# Patient Record
Sex: Female | Born: 1955 | Race: White | Hispanic: No | Marital: Single | Smoking: Current every day smoker
Health system: Southern US, Community
[De-identification: ages and names within clinical notes are randomized; demographics above are authoritative.]

## PROBLEM LIST (undated history)

## (undated) DIAGNOSIS — R079 Chest pain, unspecified: Secondary | ICD-10-CM

## (undated) DIAGNOSIS — I10 Essential (primary) hypertension: Secondary | ICD-10-CM

## (undated) DIAGNOSIS — M7750 Other enthesopathy of unspecified foot: Secondary | ICD-10-CM

## (undated) DIAGNOSIS — J45909 Unspecified asthma, uncomplicated: Secondary | ICD-10-CM

## (undated) HISTORY — PX: ABDOMINAL HYSTERECTOMY: SHX81

---

## 2000-02-23 ENCOUNTER — Emergency Department (HOSPITAL_COMMUNITY): Admission: EM | Admit: 2000-02-23 | Discharge: 2000-02-23 | Payer: Self-pay

## 2000-08-31 ENCOUNTER — Emergency Department (HOSPITAL_COMMUNITY): Admission: EM | Admit: 2000-08-31 | Discharge: 2000-08-31 | Payer: Self-pay | Admitting: Internal Medicine

## 2000-08-31 ENCOUNTER — Encounter: Payer: Self-pay | Admitting: Orthopedic Surgery

## 2000-08-31 ENCOUNTER — Encounter: Payer: Self-pay | Admitting: Emergency Medicine

## 2000-09-13 ENCOUNTER — Ambulatory Visit (HOSPITAL_BASED_OUTPATIENT_CLINIC_OR_DEPARTMENT_OTHER): Admission: RE | Admit: 2000-09-13 | Discharge: 2000-09-14 | Payer: Self-pay | Admitting: Orthopedic Surgery

## 2001-06-06 ENCOUNTER — Emergency Department (HOSPITAL_COMMUNITY): Admission: EM | Admit: 2001-06-06 | Discharge: 2001-06-06 | Payer: Self-pay | Admitting: Emergency Medicine

## 2001-06-06 ENCOUNTER — Encounter: Payer: Self-pay | Admitting: Emergency Medicine

## 2001-09-03 ENCOUNTER — Emergency Department (HOSPITAL_COMMUNITY): Admission: EM | Admit: 2001-09-03 | Discharge: 2001-09-03 | Payer: Self-pay | Admitting: Emergency Medicine

## 2001-09-03 ENCOUNTER — Encounter: Payer: Self-pay | Admitting: Emergency Medicine

## 2002-05-10 ENCOUNTER — Inpatient Hospital Stay (HOSPITAL_COMMUNITY): Admission: AD | Admit: 2002-05-10 | Discharge: 2002-05-15 | Payer: Self-pay | Admitting: *Deleted

## 2002-05-13 ENCOUNTER — Encounter: Admission: RE | Admit: 2002-05-13 | Discharge: 2002-05-13 | Payer: Self-pay | Admitting: *Deleted

## 2002-06-12 ENCOUNTER — Encounter: Admission: RE | Admit: 2002-06-12 | Discharge: 2002-06-12 | Payer: Self-pay | Admitting: *Deleted

## 2011-12-02 ENCOUNTER — Emergency Department (HOSPITAL_COMMUNITY)
Admission: EM | Admit: 2011-12-02 | Discharge: 2011-12-02 | Disposition: A | Discharge status: Home / Self Care | Payer: Self-pay | Attending: Emergency Medicine | Admitting: Emergency Medicine

## 2011-12-02 ENCOUNTER — Encounter (HOSPITAL_COMMUNITY): Payer: Self-pay | Admitting: *Deleted

## 2011-12-02 DIAGNOSIS — I10 Essential (primary) hypertension: Secondary | ICD-10-CM | POA: Insufficient documentation

## 2011-12-02 DIAGNOSIS — N390 Urinary tract infection, site not specified: Secondary | ICD-10-CM | POA: Insufficient documentation

## 2011-12-02 HISTORY — DX: Essential (primary) hypertension: I10

## 2011-12-02 LAB — URINE MICROSCOPIC-ADD ON

## 2011-12-02 LAB — URINALYSIS, ROUTINE W REFLEX MICROSCOPIC
Bilirubin Urine: NEGATIVE
Glucose, UA: NEGATIVE mg/dL
Ketones, ur: NEGATIVE mg/dL
Nitrite: POSITIVE — AB
Protein, ur: NEGATIVE mg/dL
Specific Gravity, Urine: 1.006 (ref 1.005–1.030)
Urobilinogen, UA: 1 mg/dL (ref 0.0–1.0)
pH: 7 (ref 5.0–8.0)

## 2011-12-02 MED ORDER — ONDANSETRON 4 MG PO TBDP
4.0000 mg | ORAL_TABLET | Freq: Once | ORAL | Status: AC
  Administered 2011-12-02: 4 mg via ORAL
  Filled 2011-12-02: qty 1

## 2011-12-02 MED ORDER — TRAMADOL HCL 50 MG PO TABS: 50.0000 mg | ORAL_TABLET | Freq: Four times a day (QID) | ORAL | Status: AC | PRN

## 2011-12-02 MED ORDER — PROMETHAZINE HCL 25 MG PO TABS: 12.5000 mg | ORAL_TABLET | Freq: Three times a day (TID) | ORAL | Status: DC | PRN

## 2011-12-02 MED ORDER — PHENAZOPYRIDINE HCL 95 MG PO TABS: 95.0000 mg | ORAL_TABLET | Freq: Once | ORAL | Status: AC

## 2011-12-02 MED ORDER — CEPHALEXIN 500 MG PO CAPS: 500.0000 mg | ORAL_CAPSULE | Freq: Four times a day (QID) | ORAL | Status: AC

## 2011-12-02 MED ORDER — PHENAZOPYRIDINE HCL 100 MG PO TABS
95.0000 mg | ORAL_TABLET | Freq: Once | ORAL | Status: AC
  Administered 2011-12-02: 100 mg via ORAL
  Filled 2011-12-02: qty 1

## 2011-12-02 MED ORDER — SULFAMETHOXAZOLE-TMP DS 800-160 MG PO TABS
1.0000 | ORAL_TABLET | Freq: Once | ORAL | Status: AC
  Administered 2011-12-02: 1 via ORAL
  Filled 2011-12-02: qty 1

## 2011-12-02 NOTE — ED Notes (Signed)
The pt has had burning on urination since yesterday.  She thinks she has  A uti .  She has had numerus ones in the past..  She has been using azo and her urine is orange unable to  Detect blood

## 2011-12-02 NOTE — ED Provider Notes (Signed)
History     CSN: [id number]  Arrival date & time 12/02/11  2050   None     Chief Complaint  Patient presents with  . Urinary Tract Infection    (Consider location/radiation/quality/duration/timing/severity/associated sxs/prior treatment) HPI Comments: Patient has a history of multiple UTIs.  For the last several, days.  She's had, dysuria.  She's tried taking over-the-counter Azo, without any relief.  Today.  She has developed suprapubic discomfort, as well as some nausea without vomiting  Patient is a 56 y.o. female presenting with urinary tract infection. The history is provided by the patient.  Urinary Tract Infection This is a new problem. The current episode started in the past 7 days. The problem occurs constantly. The problem has been unchanged. Associated symptoms include abdominal pain. Pertinent negatives include no chills, coughing, fever, vomiting or weakness.    Past Medical History  Diagnosis Date  . Hypertension     History reviewed. No pertinent past surgical history.  No family history on file.  History  Substance Use Topics  . Smoking status: Current Everyday Smoker  . Smokeless tobacco: Not on file  . Alcohol Use: Yes    OB History    Grav Para Term Preterm Abortions TAB SAB Ect Mult Living                  Review of Systems  Constitutional: Negative for fever and chills.  Respiratory: Negative for cough and shortness of breath.   Gastrointestinal: Positive for abdominal pain. Negative for vomiting.  Genitourinary: Positive for dysuria and frequency. Negative for hematuria and flank pain.  Musculoskeletal: Negative for back pain.  Neurological: Negative for dizziness and weakness.    Allergies  Codeine and Sulfa antibiotics  Home Medications   Current Outpatient Rx  Name Route Sig Dispense Refill  . LISINOPRIL-HYDROCHLOROTHIAZIDE 10-12.5 MG PO TABS Oral Take 1 tablet by mouth daily.    Marland Kitchen PHENAZOPYRIDINE HCL 97.5 MG PO TABS Oral Take 2  tablets by mouth every 2 (two) hours as needed. For urinary pain    . CEPHALEXIN 500 MG PO CAPS Oral Take 1 capsule (500 mg total) by mouth 4 (four) times daily. 20 capsule 0  . PHENAZOPYRIDINE HCL 95 MG PO TABS Oral Take 1 tablet (95 mg total) by mouth once. 10 tablet 0  . PROMETHAZINE HCL 25 MG PO TABS Oral Take 0.5 tablets (12.5 mg total) by mouth every 8 (eight) hours as needed for nausea. 30 tablet 0  . TRAMADOL HCL 50 MG PO TABS Oral Take 1 tablet (50 mg total) by mouth every 6 (six) hours as needed for pain. 15 tablet 0    BP 134/84  Pulse 64  Temp(Src) 97.4 F (36.3 C) (Oral)  Resp 20  SpO2 100%  Physical Exam  Constitutional: She is oriented to person, place, and time. She appears well-developed.  HENT:  Head: Normocephalic.  Eyes: Pupils are equal, round, and reactive to light.  Cardiovascular: Normal rate.   Pulmonary/Chest: Effort normal.  Abdominal:       Slight suprapubic tenderness  Musculoskeletal: Normal range of motion.  Neurological: She is alert and oriented to person, place, and time.  Skin: Skin is warm.    ED Course  Procedures (including critical care time)  Labs Reviewed  URINALYSIS, ROUTINE W REFLEX MICROSCOPIC - Abnormal; Notable for the following:    Color, Urine ORANGE (*) BIOCHEMICALS MAY BE AFFECTED BY COLOR   APPearance CLOUDY (*)    Hgb urine dipstick LARGE (*)  Nitrite POSITIVE (*)    Leukocytes, UA LARGE (*)    All other components within normal limits  URINE MICROSCOPIC-ADD ON - Abnormal; Notable for the following:    Bacteria, UA MANY (*)    All other components within normal limits   No results found.   1. UTI (lower urinary tract infection)     Patient specifically asked, allergies, and she only alluded to codeine on checking her records at time of discharge.  It lists sulfa, again, patient is questioned, and she said yes, at one time with her hysterectomy.  She had sulfa, along with pain medicine, and become violently ill, so  it uncertain if she became ill because of the pain medicine, or the sulfa.  Will discharge her him with Keflex.  I will observe her in the emergency department for the next hour to make, sure she does not become "violently ill Patient has been observed and there is no change in her condition.  No vomiting, diarrhea, tachycardia, rash, safe to send this patient with antibiotic, UTI, as well as Pyridium pain  MDM   Urine shows that she has a urinary tract infection       Arman Filter, NP 12/02/11 2351

## 2011-12-02 NOTE — ED Notes (Signed)
Received bedside report from Clydie Braun, Charity fundraiser.  Patient currently resting quietly in bed; no respiratory or acute distress noted.  Patient updated on plan of care; informed patient that we are still watching her for possible reaction from antibiotic.  Patient has no other questions or concerns; will continue to monitor.

## 2011-12-02 NOTE — Discharge Instructions (Signed)
Urinary Tract Infection A urinary tract infection (UTI) is often caused by a germ (bacteria). A UTI is usually helped with medicine (antibiotics) that kills germs. Take all the medicine until it is gone. Do this even if you are feeling better. You are usually better in 7 to 10 days. HOME CARE   Drink enough water and fluids to keep your pee (urine) clear or pale yellow. Drink:   Cranberry juice.   Water.   Avoid:   Caffeine.   Tea.   Bubbly (carbonated) drinks.   Alcohol.   Only take medicine as told by your doctor.   To prevent further infections:   Pee often.   After pooping (bowel movement), women should wipe from front to back. Use each tissue only once.   Pee before and after having sex (intercourse).  Ask your doctor when your test results will be ready. Make sure you follow up and get your test results.  GET HELP RIGHT AWAY IF:   There is very bad back pain or lower belly (abdominal) pain.   You get the chills.   You have a fever.   Your baby is older than 3 months with a rectal temperature of 102 F (38.9 C) or higher.   Your baby is 3 months old or younger with a rectal temperature of 100.4 F (38 C) or higher.   You feel sick to your stomach (nauseous) or throw up (vomit).   There is continued burning with peeing.   Your problems are not better in 3 days. Return sooner if you are getting worse.  MAKE SURE YOU:   Understand these instructions.   Will watch your condition.   Will get help right away if you are not doing well or get worse.  Document Released: 11/28/2007 Document Revised: 05/31/2011 Document Reviewed: 11/28/2007 [employer] Patient Information 2012 [employer]. 

## 2011-12-02 NOTE — ED Notes (Signed)
Patient given discharge paperwork; went over discharge instructions with patient.  Patient instructed to take prescriptions as directed, to not drive while taking pain medications, to increase fluid intake, to follow up with primary care physician if symptoms persist more than 2-3 days, and to return to the ED for new, worsening, or concerning symptoms.

## 2011-12-04 NOTE — ED Provider Notes (Signed)
Medical screening examination/treatment/procedure(s) were performed by non-physician practitioner and as supervising physician I was immediately available for consultation/collaboration.   Carleene Cooper III, MD 12/04/11 [phone]

## 2012-03-30 ENCOUNTER — Ambulatory Visit (HOSPITAL_COMMUNITY)
Admission: RE | Admit: 2012-03-30 | Discharge: 2012-03-30 | Disposition: A | Discharge status: Home / Self Care | Payer: Self-pay | Attending: Psychiatry | Admitting: Psychiatry

## 2012-03-30 ENCOUNTER — Encounter (HOSPITAL_COMMUNITY): Payer: Self-pay | Admitting: *Deleted

## 2012-03-30 ENCOUNTER — Emergency Department (HOSPITAL_COMMUNITY): Payer: Self-pay

## 2012-03-30 ENCOUNTER — Emergency Department (HOSPITAL_COMMUNITY)
Admission: EM | Admit: 2012-03-30 | Discharge: 2012-04-01 | Disposition: A | Discharge status: Other Acute Inpatient Hospital | Payer: Self-pay | Attending: Emergency Medicine | Admitting: Emergency Medicine

## 2012-03-30 DIAGNOSIS — J45909 Unspecified asthma, uncomplicated: Secondary | ICD-10-CM | POA: Insufficient documentation

## 2012-03-30 DIAGNOSIS — R079 Chest pain, unspecified: Secondary | ICD-10-CM

## 2012-03-30 DIAGNOSIS — F411 Generalized anxiety disorder: Secondary | ICD-10-CM | POA: Insufficient documentation

## 2012-03-30 DIAGNOSIS — R0789 Other chest pain: Secondary | ICD-10-CM | POA: Insufficient documentation

## 2012-03-30 DIAGNOSIS — F17200 Nicotine dependence, unspecified, uncomplicated: Secondary | ICD-10-CM | POA: Insufficient documentation

## 2012-03-30 DIAGNOSIS — R0602 Shortness of breath: Secondary | ICD-10-CM | POA: Insufficient documentation

## 2012-03-30 DIAGNOSIS — I10 Essential (primary) hypertension: Secondary | ICD-10-CM | POA: Insufficient documentation

## 2012-03-30 DIAGNOSIS — F419 Anxiety disorder, unspecified: Secondary | ICD-10-CM

## 2012-03-30 DIAGNOSIS — F329 Major depressive disorder, single episode, unspecified: Secondary | ICD-10-CM | POA: Insufficient documentation

## 2012-03-30 DIAGNOSIS — M7750 Other enthesopathy of unspecified foot: Secondary | ICD-10-CM

## 2012-03-30 DIAGNOSIS — Z79899 Other long term (current) drug therapy: Secondary | ICD-10-CM | POA: Insufficient documentation

## 2012-03-30 DIAGNOSIS — F332 Major depressive disorder, recurrent severe without psychotic features: Secondary | ICD-10-CM | POA: Insufficient documentation

## 2012-03-30 DIAGNOSIS — F3289 Other specified depressive episodes: Secondary | ICD-10-CM | POA: Insufficient documentation

## 2012-03-30 HISTORY — DX: Chest pain, unspecified: R07.9

## 2012-03-30 HISTORY — DX: Other enthesopathy of unspecified foot and ankle: M77.50

## 2012-03-30 HISTORY — DX: Unspecified asthma, uncomplicated: J45.909

## 2012-03-30 LAB — COMPREHENSIVE METABOLIC PANEL
ALT: 29 U/L (ref 0–35)
AST: 18 U/L (ref 0–37)
Alkaline Phosphatase: 72 U/L (ref 39–117)
CO2: 28 mEq/L (ref 19–32)
Calcium: 9.1 mg/dL (ref 8.4–10.5)
Chloride: 100 mEq/L (ref 96–112)
GFR calc Af Amer: >90 mL/min (ref >90)
GFR calc non Af Amer: >90 mL/min (ref >90)
Glucose, Bld: 71 mg/dL (ref 70–99)
Potassium: 3.5 mEq/L (ref 3.5–5.1)
Sodium: 138 mEq/L (ref 135–145)
Total Bilirubin: 0.3 mg/dL (ref 0.3–1.2)

## 2012-03-30 LAB — POCT I-STAT TROPONIN I
Troponin i, poc: 0.01 ng/mL (ref 0.00–0.08)
Troponin i, poc: 0.01 ng/mL (ref 0.00–0.08)

## 2012-03-30 LAB — SALICYLATE LEVEL: Salicylate Lvl: <2 mg/dL — ABNORMAL LOW (ref 2.8–20.0)

## 2012-03-30 LAB — ACETAMINOPHEN LEVEL: Acetaminophen (Tylenol), Serum: <15 ug/mL (ref 10–30)

## 2012-03-30 LAB — CBC
Hemoglobin: 14 g/dL (ref 12.0–15.0)
MCH: 31.5 pg (ref 26.0–34.0)
Platelets: 350 10*3/uL (ref 150–400)
RBC: 4.44 MIL/uL (ref 3.87–5.11)
WBC: 10 10*3/uL (ref 4.0–10.5)

## 2012-03-30 LAB — RAPID URINE DRUG SCREEN, HOSP PERFORMED
Barbiturates: NOT DETECTED
Tetrahydrocannabinol: POSITIVE — AB

## 2012-03-30 MED ORDER — ACETAMINOPHEN 325 MG PO TABS: 650.0000 mg | ORAL_TABLET | ORAL | Status: DC | PRN

## 2012-03-30 MED ORDER — LORAZEPAM 1 MG PO TABS
1.0000 mg | ORAL_TABLET | Freq: Three times a day (TID) | ORAL | Status: DC | PRN
  Administered 2012-03-30 – 2012-04-01 (×5): 1 mg via ORAL
  Filled 2012-03-30 (×8): qty 1

## 2012-03-30 MED ORDER — ONDANSETRON HCL 4 MG PO TABS: 4.0000 mg | ORAL_TABLET | Freq: Three times a day (TID) | ORAL | Status: DC | PRN

## 2012-03-30 MED ORDER — ZOLPIDEM TARTRATE 5 MG PO TABS: 5.0000 mg | ORAL_TABLET | Freq: Every evening | ORAL | Status: DC | PRN

## 2012-03-30 MED ORDER — IBUPROFEN 200 MG PO TABS
600.0000 mg | ORAL_TABLET | Freq: Three times a day (TID) | ORAL | Status: DC | PRN
  Administered 2012-03-30 – 2012-03-31 (×2): 600 mg via ORAL
  Filled 2012-03-30 (×3): qty 3

## 2012-03-30 MED ORDER — ASPIRIN 81 MG PO CHEW
324.0000 mg | CHEWABLE_TABLET | Freq: Once | ORAL | Status: AC
  Administered 2012-03-30: 324 mg via ORAL
  Filled 2012-03-30: qty 4

## 2012-03-30 NOTE — ED Notes (Signed)
Pt went to [facility] and was sent here, no rooms available at facility, pt c/o depression and nerve problems. Has been on Prozac, family told here to stop taking. No S/I or H/I

## 2012-03-30 NOTE — ED Notes (Signed)
Environmental consultant in to see and evaluate this pt

## 2012-03-30 NOTE — ED Notes (Addendum)
Pt has been wanded and changed into scrubs and socks. 1 bag of personal belongings in locker #26
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
## 2012-03-30 NOTE — ED Notes (Signed)
Charge [employer] RN given report- in with this pt.  Pt remains calm and cooperative with care

## 2012-03-30 NOTE — ED Notes (Signed)
Lab at bs

## 2012-03-30 NOTE — ED Provider Notes (Signed)
History     CSN: [id number]  Arrival date & time 03/30/12  1343   First MD Initiated Contact with Patient 03/30/12 1425      Chief Complaint  Patient presents with  . Medical Clearance    (Consider location/radiation/quality/duration/timing/severity/associated sxs/prior treatment) HPI Comments: Carolyn Vincent is a 56 y.o. Female who presents to ED complaining of depression, anxiety, chest pain. States has been battling depression and anxiety for a long time, states it is worsening. Denies SI and HI. States also having chest pains for last 6 months. States worsened with anxiety, stress, and exertion. States pain usually starts around lunch time and lasts all day. States at times wakes up with it. Pain is pressure like. Not improved by anything. Denies pleuritic component. Denies leg swelling. States often feels short of breath, nauseated.  States has never seen or been evaluated for it in the past. [facility] and was sent here for medical clearance. Pt apparently is accepted there for treatment of her depression, as soon as a bed becomes available.    Past Medical History  Diagnosis Date  . Hypertension   . Tendonitis of ankle or foot 03/30/2012    Left side  . Asthma 03/30/2012    Reports Hx of "asthma attacks" but not asthma.  . Chest pain 03/30/2012    Recurrent; scales at 5 of 10 at this time (12:45)    Past Surgical History  Procedure Date  . Abdominal hysterectomy   . Arm amputation     No family history on file.  History  Substance Use Topics  . Smoking status: Current Every Day Smoker -- 1.0 packs/day for 40 years    Types: Cigarettes  . Smokeless tobacco: Never Used  . Alcohol Use: Yes     occ    OB History    Grav Para Term Preterm Abortions TAB SAB Ect Mult Living                  Review of Systems  Constitutional: Negative for fever and chills.  HENT: Negative for neck pain and neck stiffness.   Respiratory: Positive for chest  tightness and shortness of breath. Negative for cough and wheezing.   Cardiovascular: Positive for chest pain. Negative for palpitations and leg swelling.  Gastrointestinal: Positive for nausea. Negative for vomiting, abdominal pain, diarrhea and constipation.  Genitourinary: Negative for dysuria.  Musculoskeletal: Negative for back pain.  Skin: Negative.   Neurological: Negative for dizziness, weakness, numbness and headaches.  Hematological: Negative.     Allergies  Codeine and Sulfa antibiotics  Home Medications   Current Outpatient Rx  Name Route Sig Dispense Refill  . ALPRAZOLAM 1 MG PO TABS Oral Take 1 mg by mouth 4 (four) times daily as needed. For anxiety.    . INDOMETHACIN 25 MG PO CAPS Oral Take 25 mg by mouth 2 (two) times daily with a meal.     . LISINOPRIL-HYDROCHLOROTHIAZIDE 10-12.5 MG PO TABS Oral Take 1 tablet by mouth every evening.       BP 149/89  Pulse 82  Temp 97.5 F (36.4 C) (Oral)  SpO2 100%  Physical Exam  Nursing note and vitals reviewed. Constitutional: She is oriented to person, place, and time. She appears well-developed and well-nourished. No distress.  HENT:  Head: Normocephalic.  Eyes: Conjunctivae normal are normal.  Neck: Neck supple.  Cardiovascular: Normal rate, regular rhythm and normal heart sounds.   Pulmonary/Chest: Effort normal and breath sounds  normal. No respiratory distress. She has no wheezes. She has no rales. She exhibits no tenderness.  Abdominal: Soft. Bowel sounds are normal. She exhibits no distension. There is no tenderness. There is no rebound.  Musculoskeletal: She exhibits no edema.  Neurological: She is alert and oriented to person, place, and time.  Skin: Skin is warm and dry.  Psychiatric:       anxious    ED Course  Procedures (including critical care time)  Pt with anxiety and chest pain. Pain sounds atypical, however, pt has no prior CP work up. WIll get ECG, enzymes, labs, CXR. Pt is a smoker, hx of asthma.  Could be related to asthma and early copd, anxiety vs cad.    Date: 03/30/2012  Rate: 82  Rhythm: normal sinus rhythm  QRS Axis: normal  Intervals: normal  ST/T Wave abnormalities: normal  Conduction Disutrbances:none  Narrative Interpretation:   Old EKG Reviewed: none available   Results for orders placed during the hospital encounter of 03/30/12  CBC      Component Value Range   WBC 10.0  4.0 - 10.5 K/uL   RBC 4.44  3.87 - 5.11 MIL/uL   Hemoglobin 14.0  12.0 - 15.0 g/dL   HCT 16.1  09.6 - 04.5 %   MCV 87.6  78.0 - 100.0 fL   MCH 31.5  26.0 - 34.0 pg   MCHC 36.0  30.0 - 36.0 g/dL   RDW 40.9  81.1 - 91.4 %   Platelets 350  150 - 400 K/uL  COMPREHENSIVE METABOLIC PANEL      Component Value Range   Sodium 138  135 - 145 mEq/L   Potassium 3.5  3.5 - 5.1 mEq/L   Chloride 100  96 - 112 mEq/L   CO2 28  19 - 32 mEq/L   Glucose, Bld 71  70 - 99 mg/dL   BUN 10  6 - 23 mg/dL   Creatinine, Ser 7.82  0.50 - 1.10 mg/dL   Calcium 9.1  8.4 - 95.6 mg/dL   Total Protein 6.4  6.0 - 8.3 g/dL   Albumin 3.8  3.5 - 5.2 g/dL   AST 18  0 - 37 U/L   ALT 29  0 - 35 U/L   Alkaline Phosphatase 72  39 - 117 U/L   Total Bilirubin 0.3  0.3 - 1.2 mg/dL   GFR calc non Af Amer >90  >90 mL/min   GFR calc Af Amer >90  >90 mL/min  ETHANOL      Component Value Range   Alcohol, Ethyl (B) <11  0 - 11 mg/dL  URINE RAPID DRUG SCREEN (HOSP PERFORMED)      Component Value Range   Opiates NONE DETECTED  NONE DETECTED   Cocaine NONE DETECTED  NONE DETECTED   Benzodiazepines POSITIVE (*) NONE DETECTED   Amphetamines NONE DETECTED  NONE DETECTED   Tetrahydrocannabinol POSITIVE (*) NONE DETECTED   Barbiturates NONE DETECTED  NONE DETECTED  ACETAMINOPHEN LEVEL      Component Value Range   Acetaminophen (Tylenol), Serum <15.0  10 - 30 ug/mL  SALICYLATE LEVEL      Component Value Range   Salicylate Lvl <2.0 (*) 2.8 - 20.0 mg/dL  POCT I-STAT TROPONIN I      Component Value Range   Troponin i, poc 0.01  0.00  - 0.08 ng/mL   Comment 3            Dg Chest 2 View  03/30/2012  *  RADIOLOGY REPORT*  Clinical Data: Medical clearance.  Smoker.  CHEST - 2 VIEW  Comparison: 09/01/2007 radiographs.  Findings: The heart size and mediastinal contours are normal. The lungs are clear. There is no pleural effusion or pneumothorax. No acute osseous findings are identified.  An EKG snap overlies the upper left chest on the frontal examination.  IMPRESSION: No active cardiopulmonary process.   Original Report Authenticated By: Gerrianne Scale, M.D.    3:45 PM Labs unremarkable. Troponin x1 negative. CXR negative. ECG unremarkable. I suspect pt's symptoms are caused mainly by anxiety, given she is very anxious here in ed. She already accepted by [facility], confirmed by [facility]. Pt here pending bed. Will recheck another troponin at 7pm   1. Anxiety   2. Depression       MDM          Lottie Mussel, [place] 04/02/12 2054

## 2012-03-30 NOTE — ED Notes (Signed)
Pt very anxious and focusing that she is having chest pain. Pain reported to be present when she feels anxious and panicky. Talking continuously about feeling depressed and anxious, again main focus is chest discomfort, pt denies any intent to harm self or anyone else, well dressed and compliant upon arrival. States she took Prozac for a long time until her family convinced her to stop taking it, she states she felt much better while on the medication. Pt went to [facility] and due to no rooms was sent here for clearance and wait for available bed to come open.

## 2012-03-31 MED ORDER — NICOTINE 21 MG/24HR TD PT24
21.0000 mg | MEDICATED_PATCH | Freq: Once | TRANSDERMAL | Status: DC
  Administered 2012-03-31: 21 mg via TRANSDERMAL
  Filled 2012-03-31: qty 1

## 2012-03-31 MED ORDER — DIPHENHYDRAMINE HCL 25 MG PO CAPS
25.0000 mg | ORAL_CAPSULE | Freq: Once | ORAL | Status: AC
  Administered 2012-03-31: 25 mg via ORAL

## 2012-03-31 MED ORDER — LISINOPRIL 10 MG PO TABS
10.0000 mg | ORAL_TABLET | Freq: Two times a day (BID) | ORAL | Status: DC
  Administered 2012-03-31: 10 mg via ORAL
  Filled 2012-03-31 (×4): qty 1

## 2012-03-31 MED ORDER — DIPHENHYDRAMINE HCL 25 MG PO CAPS
ORAL_CAPSULE | ORAL | Status: AC
  Filled 2012-03-31: qty 1

## 2012-03-31 MED ORDER — LORAZEPAM 1 MG PO TABS
1.0000 mg | ORAL_TABLET | Freq: Once | ORAL | Status: AC
  Administered 2012-03-31: 1 mg via ORAL

## 2012-03-31 NOTE — ED Provider Notes (Signed)
No new c/o  Carolyn Hutching, MD 03/31/12 [phone]

## 2012-03-31 NOTE — ED Notes (Signed)
Patient reports headache is still a 6.  Ativan 1mg  given prn.

## 2012-03-31 NOTE — ED Notes (Signed)
Pt c/o headache she rates as a 6.  Pt requesting ibuprofen for pain.  600mg  given per prn order.

## 2012-04-01 ENCOUNTER — Inpatient Hospital Stay (HOSPITAL_COMMUNITY)
Admission: AD | Admit: 2012-04-01 | Discharge: 2012-04-03 | DRG: 885 | Disposition: A | Discharge status: Home / Self Care | Payer: Federal, State, Local not specified - Other | Source: Ambulatory Visit | Attending: Psychiatry | Admitting: Psychiatry

## 2012-04-01 ENCOUNTER — Encounter (HOSPITAL_COMMUNITY): Payer: Self-pay | Admitting: *Deleted

## 2012-04-01 ENCOUNTER — Telehealth (HOSPITAL_COMMUNITY): Payer: Self-pay | Admitting: Licensed Clinical Social Worker

## 2012-04-01 DIAGNOSIS — IMO0002 Reserved for concepts with insufficient information to code with codable children: Secondary | ICD-10-CM

## 2012-04-01 DIAGNOSIS — F329 Major depressive disorder, single episode, unspecified: Secondary | ICD-10-CM | POA: Diagnosis present

## 2012-04-01 DIAGNOSIS — F1320 Sedative, hypnotic or anxiolytic dependence, uncomplicated: Secondary | ICD-10-CM

## 2012-04-01 DIAGNOSIS — Z79899 Other long term (current) drug therapy: Secondary | ICD-10-CM

## 2012-04-01 DIAGNOSIS — F332 Major depressive disorder, recurrent severe without psychotic features: Principal | ICD-10-CM | POA: Diagnosis present

## 2012-04-01 DIAGNOSIS — R079 Chest pain, unspecified: Secondary | ICD-10-CM | POA: Diagnosis present

## 2012-04-01 DIAGNOSIS — T424X5A Adverse effect of benzodiazepines, initial encounter: Secondary | ICD-10-CM | POA: Diagnosis present

## 2012-04-01 DIAGNOSIS — J45909 Unspecified asthma, uncomplicated: Secondary | ICD-10-CM | POA: Diagnosis present

## 2012-04-01 DIAGNOSIS — F411 Generalized anxiety disorder: Secondary | ICD-10-CM

## 2012-04-01 DIAGNOSIS — F418 Other specified anxiety disorders: Secondary | ICD-10-CM | POA: Diagnosis present

## 2012-04-01 DIAGNOSIS — I10 Essential (primary) hypertension: Secondary | ICD-10-CM | POA: Diagnosis present

## 2012-04-01 MED ORDER — FLUOXETINE HCL 10 MG PO CAPS
10.0000 mg | ORAL_CAPSULE | ORAL | Status: DC
  Administered 2012-04-02: 10 mg via ORAL
  Filled 2012-04-01 (×3): qty 1

## 2012-04-01 MED ORDER — ARIPIPRAZOLE 10 MG PO TABS
10.0000 mg | ORAL_TABLET | Freq: Once | ORAL | Status: AC
  Administered 2012-04-01: 10 mg via ORAL
  Filled 2012-04-01 (×2): qty 1

## 2012-04-01 MED ORDER — MAGNESIUM HYDROXIDE 400 MG/5ML PO SUSP: 30.0000 mL | Freq: Every day | ORAL | Status: DC | PRN

## 2012-04-01 MED ORDER — LISINOPRIL-HYDROCHLOROTHIAZIDE 10-12.5 MG PO TABS: 1.0000 | ORAL_TABLET | Freq: Every evening | ORAL | Status: DC

## 2012-04-01 MED ORDER — INDOMETHACIN 25 MG PO CAPS
25.0000 mg | ORAL_CAPSULE | Freq: Two times a day (BID) | ORAL | Status: DC
  Filled 2012-04-01 (×3): qty 1

## 2012-04-01 MED ORDER — CHLORDIAZEPOXIDE HCL 25 MG PO CAPS: 25.0000 mg | ORAL_CAPSULE | Freq: Four times a day (QID) | ORAL | Status: DC | PRN

## 2012-04-01 MED ORDER — HYDROCHLOROTHIAZIDE 12.5 MG PO CAPS
12.5000 mg | ORAL_CAPSULE | Freq: Every day | ORAL | Status: DC
  Administered 2012-04-01 – 2012-04-02 (×2): 12.5 mg via ORAL
  Filled 2012-04-01 (×5): qty 1

## 2012-04-01 MED ORDER — NICOTINE 21 MG/24HR TD PT24
21.0000 mg | MEDICATED_PATCH | Freq: Every day | TRANSDERMAL | Status: DC
  Administered 2012-04-01 – 2012-04-03 (×3): 21 mg via TRANSDERMAL
  Filled 2012-04-01 (×5): qty 1
  Filled 2012-04-01: qty 14

## 2012-04-01 MED ORDER — HYDROXYZINE HCL 50 MG PO TABS
50.0000 mg | ORAL_TABLET | Freq: Every evening | ORAL | Status: DC | PRN
  Administered 2012-04-01 – 2012-04-02 (×3): 50 mg via ORAL
  Filled 2012-04-01: qty 1
  Filled 2012-04-01: qty 28
  Filled 2012-04-01 (×6): qty 1
  Filled 2012-04-01: qty 28

## 2012-04-01 MED ORDER — CLONIDINE HCL 0.1 MG/24HR TD PTWK
0.1000 mg | MEDICATED_PATCH | TRANSDERMAL | Status: DC
  Administered 2012-04-01: 0.1 mg via TRANSDERMAL
  Filled 2012-04-01 (×2): qty 1

## 2012-04-01 MED ORDER — ACETAMINOPHEN 325 MG PO TABS: 650.0000 mg | ORAL_TABLET | Freq: Four times a day (QID) | ORAL | Status: DC | PRN

## 2012-04-01 MED ORDER — FLUOXETINE HCL 10 MG PO CAPS
10.0000 mg | ORAL_CAPSULE | Freq: Every day | ORAL | Status: DC
  Filled 2012-04-01 (×2): qty 1

## 2012-04-01 MED ORDER — ALPRAZOLAM 0.5 MG PO TABS
0.5000 mg | ORAL_TABLET | Freq: Three times a day (TID) | ORAL | Status: DC
  Administered 2012-04-01: 0.5 mg via ORAL
  Filled 2012-04-01: qty 1

## 2012-04-01 MED ORDER — ALUM & MAG HYDROXIDE-SIMETH 200-200-20 MG/5ML PO SUSP: 30.0000 mL | ORAL | Status: DC | PRN

## 2012-04-01 MED ORDER — LISINOPRIL 10 MG PO TABS
10.0000 mg | ORAL_TABLET | Freq: Every day | ORAL | Status: DC
  Administered 2012-04-01 – 2012-04-02 (×2): 10 mg via ORAL
  Filled 2012-04-01 (×5): qty 1

## 2012-04-01 NOTE — H&P (Signed)
Medical/psychiatric screening examination/treatment/procedure(s) were performed by non-physician practitioner and as supervising physician I was immediately available for consultation/collaboration.  I have seen and examined this patient and agree with the major elements of this evaluation.  
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
## 2012-04-01 NOTE — Progress Notes (Signed)
Psychoeducational Group Note  Date:  04/01/2012 Time: 2000  Group Topic/Focus:  AA Meeting.  Participation Level:  Active  Participation Quality:  Patient did attend AA meeting tonight.  Affect:    Cognitive:  Oriented  Insight:    Engagement in Group:    Additional Comments:  Patient did attend AA meeting tonight.  Alexander Aument, [place] 04/01/2012, 9:47 PM

## 2012-04-01 NOTE — ED Notes (Signed)
Pt states "I was giving an abx for my foot that had sulphur in it, I'm allergic to sulphur, it made me have 3 anxiety attacks, I thought it was my heart, does it normally take this long to get a bed across the street?"; informed pt we never know when rooms become available across the street until they call stating they have a room; pt verbalized understanding.

## 2012-04-01 NOTE — [facility] Counselor (Signed)
Patient accepted to [facility] by Dr. Theotis Barrio to Dr. Dan Humphreys. The bed assignment is 507-1. All support paperwork completed and faxed to [facility]. The EDP-Dr. Anitra Lauth made aware of patient's disposition and agrees to discharge patient to [facility] for inpatient treatment. Also, spoke to patient's nurse-Elaine and made her aware of patient's disposition. She will call report prior discharging patient and other arrangements as needed to transition patient from [facility] to [facility].

## 2012-04-01 NOTE — Progress Notes (Signed)
[facility] Adult Inpatient Family/Significant Other Suicide Prevention Education  Suicide Prevention Education:  Patient Refusal for Family/Significant Other Suicide Prevention Education: The patient Carolyn Vincent has refused to provide written consent for family/significant other to be provided Family/Significant Other Suicide Prevention Education during admission and/or prior to discharge.  Physician notified.  Christen Butter 04/01/2012, 3:34 PM

## 2012-04-01 NOTE — Tx Team (Signed)
Initial Interdisciplinary Treatment Plan  PATIENT STRENGTHS: (choose at least two) Ability for insight Average or above average intelligence Capable of independent living Communication skills General fund of knowledge Motivation for treatment/growth Supportive family/friends  PATIENT STRESSORS: Financial difficulties Marital or family conflict Medication change or noncompliance Occupational concerns Substance abuse   PROBLEM LIST: Problem List/Patient Goals Date to be addressed Date deferred Reason deferred Estimated date of resolution  Substance abuse 04-01-2012   D/c        anxiety 04-01-2012   D/c        depression 04-01-2012   D/c                           DISCHARGE CRITERIA:  Ability to meet basic life and health needs Adequate post-discharge living arrangements Improved stabilization in mood, thinking, and/or behavior Motivation to continue treatment in a less acute level of care Need for constant or close observation no longer present Reduction of life-threatening or endangering symptoms to within safe limits Safe-care adequate arrangements made Verbal commitment to aftercare and medication compliance Withdrawal symptoms are absent or subacute and managed without 24-hour nursing intervention  PRELIMINARY DISCHARGE PLAN: Attend aftercare/continuing care group Attend PHP/IOP Attend 12-step recovery group Outpatient therapy Participate in family therapy Placement in alternative living arrangements  PATIENT/FAMIILY INVOLVEMENT: This treatment plan has been presented to and reviewed with the patient, ROSINA CRESSLER.  The patient and family have been given the opportunity to ask questions and make suggestions.  Quintella Reichert [facility] 04/01/2012, 11:21 AM

## 2012-04-01 NOTE — [facility] Suicide Risk Assessment (Signed)
Suicide Risk Assessment  Admission Assessment     Nursing information obtained from:    Demographic factors:    Current Mental Status:    Loss Factors:    Historical Factors:    Risk Reduction Factors:     CLINICAL FACTORS:   Severe Anxiety and/or Agitation  COGNITIVE FEATURES THAT CONTRIBUTE TO RISK:  Thought constriction (tunnel vision)    SUICIDE RISK:   Minimal: No identifiable suicidal ideation.  Patients presenting with no risk factors but with morbid ruminations; may be classified as minimal risk based on the severity of the depressive symptoms  PLAN OF CARE: Pt has passive suicidal ideation.  She will report any suicidal thoughts and or plans to staff.  She will participate in group therapy, individual therapy; meet with the psychiatrist and discuss medication choices.  She will report any side effects and any suicidal thoughts to staff.  She will learn about the long term side effects of benzodiazepines.  She says she is willing to start a very gradual taper of Xanax and transition to other medication and behavioral approaches to relaxation.  She will report any suicidal thoughts at least 2 days before discharge.  It is encouraged that she continue treatment with a therapist to learn to self-soothe with meditation and preferable continue with a psychiatrist.  Mickeal Skinner 04/01/2012, 3:33 PM
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
## 2012-04-01 NOTE — Progress Notes (Signed)
[facility] Group Notes:  (Counselor/Nursing/MHT/Case Management/Adjunct)  04/01/2012 3:00 PM  Type of Therapy: Group Therapy   Participation Level: Minimal   Participation Quality: Limited  Affect: Depressed  Cognitive: oriented, alert   Insight: minimal  Engagement in Group: Limited  Modes of Intervention: Clarification, Education, Problem-solving, Socialization, Encouragement and Support   Summary of Progress/Problems: Pt participated in group by listening attentively and self disclosing.  Therapist addressed the concept of Recovery.  Therapist prompted patients to explain their understanding of their diagnosis and identify 2 symptoms of their disease.  Therapist facilitated a discussion about the social stigma surrounding mental health.  Therapist asked patients to explain how their families understand and deal with their disease.   Pt stated she has Anxiety with severe panic attacks.  Her symptoms include fear, hyperventilating, unable to breath.  Pt explained she was barely surviving.  Pt was receptive to positive feedback. Therapist encouraged patients to develop and maintain strong support groups to enhance their recovery efforts.  Therapist offered support and encouragement.  Some progress noted.  Intervention effective.         Carolyn Vincent C 10/8//2013  3:00 PM
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
## 2012-04-01 NOTE — Progress Notes (Signed)
Pt is a new admit to the 500 hall this evening.  Presented to the ED with persistent panic attacks with SI to OD on Ambien.  Pt reports a lot of family conflict and relationship problems.  She was started on Xanax by her family MD, but feels it is causing her to have chest pains.  She is adamant that she does not have "drug problems".  She drinks beer occasionally and smokes marijuana.  She would like to be detoxed from the Xanax.  She is pleasant/cooperative.  Orders were received.  She was started on a Clonidine patch 0.1 mg as her BP was elevated.  Pt was encouraged to make her needs known to staff.  Pt voices no needs/concerns at this time.  Pt denies SI/HI/AV.  Safety maintained with q15 minute checks.

## 2012-04-01 NOTE — [facility] Suicide Risk Assessment (Signed)
Suicide Risk Assessment  Admission Assessment     Nursing information obtained from:    Demographic factors:    Current Mental Status:    Loss Factors:    Historical Factors:    Risk Reduction Factors:     CLINICAL FACTORS:   Severe Anxiety and/or Agitation  COGNITIVE FEATURES THAT CONTRIBUTE TO RISK:  Thought constriction (tunnel vision)    SUICIDE RISK:   Moderate:  Frequent suicidal ideation with limited intensity, and duration, some specificity in terms of plans, no associated intent, good self-control, limited dysphoria/symptomatology, some risk factors present, and identifiable protective factors, including available and accessible social support.  PLAN OF CARE:Carolyn Vincent will participate group therapy, individual session, take medication(s)and report any side effects to the MD, report any suicidal thoughts or plans and she will learn about benzodiazepine short and long term side effects. She says she is willing to taper from the 2-3 Xanax/night to gradually DC this medication .  She will report loss of suicidal thoughts at least 2 days prior to discharge and work on self-soothing techniques to discover alternatives to self-destructive actions.  She is encouraged to begin seeing an outpatient psychiatrist and/or therapist for mood regulation.   Carolyn Vincent 04/01/2012, 3:05 PM

## 2012-04-02 ENCOUNTER — Encounter (HOSPITAL_COMMUNITY): Payer: Self-pay

## 2012-04-02 DIAGNOSIS — T424X5A Adverse effect of benzodiazepines, initial encounter: Secondary | ICD-10-CM | POA: Diagnosis present

## 2012-04-02 DIAGNOSIS — F332 Major depressive disorder, recurrent severe without psychotic features: Principal | ICD-10-CM

## 2012-04-02 LAB — TSH: TSH: 1.489 u[IU]/mL (ref 0.350–4.500)

## 2012-04-02 NOTE — Progress Notes (Signed)
  D) Patient anxious and needy upon my assessment. Patient states slept "fair," and  appetite is "improving." Patient rates depression as  6 /10, patient rates hopeless feelings as  6/10. Patient denies SI/HI, denies A/V hallucinations.   A) Patient offered support and encouragement, patient encouraged to discuss feelings/concerns with staff. Patient verbalized understanding. Patient monitored Q15 minutes for safety. Patient met with MD and treatment team to discuss today's goals and plan of care.  R) Patient active on unit, attending groups in day room and meals in dining room.  Patient has a plan to take "no more xanax and continue her antidepressants."  Patient taking medications as ordered. Will continue to monitor.

## 2012-04-02 NOTE — ED Provider Notes (Signed)
Medical screening examination/treatment/procedure(s) were performed by non-physician practitioner and as supervising physician I was immediately available for consultation/collaboration.   Lyanne Co, MD 04/02/12 2109

## 2012-04-02 NOTE — Progress Notes (Signed)
Nutrition Note  Reason: MST score 2  Patient reported her appetite has not been well over the past week. She reported this happens frequently due to the feeling of nausea. She reported her weight is down 2 lb over 2 weeks. She reported a weight of 118 lb at a doctors appointment two weeks ago. She reported she is not eating well because she is not at home.   Wt Readings from Last 10 Encounters:  04/01/12 116 lb (52.617 kg)   I have encouraged the patient to have adequate PO intake. She denies the need the need for nutritional supplements. I have educated the patient on good sources of protein and I have encouraged her to incorporate them into her meals. I recommend she try carnation instant breakfast after discharge to help increase caloric and protein intake. She was without any nutrition related questions or concerns and verbalized understanding of the nutrition information provided.   RD available for nutrition needs.   Iven Finn [facility] [phone]

## 2012-04-02 NOTE — Progress Notes (Signed)
Psychoeducational Group Note  Date:  04/02/2012 Time:  1000  Group Topic/Focus:  Personal Developement- Wisdom Cards Activity  Participation Level:  Active  Participation Quality:  Appropriate, Attentive and Sharing  Affect:  Appropriate  Cognitive:  Appropriate  Insight:  Good  Engagement in Group:  Good  Additional Comments: Pt attended group and was given two cards related to wisdom. Pt used two cards to explain to group setting about how the card applied in personal life and how the card could be used as a positive factor. Pt cards received stated healing (not blaming self for things not control of) Pt other card stated release unecessary thoughts.    Karleen Hampshire Brittini 04/02/2012, 2:01 PM
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
## 2012-04-02 NOTE — Progress Notes (Signed)
Psychoeducational Group Note  Date:  04/02/2012 Time:  2000  Group Topic/Focus:  Wrap-Up Group:   The focus of this group is to help patients review their daily goal of treatment and discuss progress on daily workbooks.  Participation Level:  Active  Participation Quality:  Appropriate, Attentive, Sharing and Supportive  Affect:  Anxious  Cognitive:  Appropriate  Insight:  Good  Engagement in Group:  Good  Additional Comments:  Patient shared that she needed to focus on the things she did have in her control and to attempt to "let go of" things she did not "have in [her] control.  Taja Pentland, Newton Pigg 04/02/2012, 9:57 PM

## 2012-04-02 NOTE — Progress Notes (Signed)
Psychoeducational Group Note  Date:  04/02/2012 Time:  1100  Group Topic/Focus:  Personal Choices and Values:   The focus of this group is to help patients assess and explore the importance of values in their lives, how their values affect their decisions, how they express their values and what opposes their expression.  Participation Level:  Active  Participation Quality:  Appropriate, Sharing and Supportive  Affect:  Appropriate  Cognitive:  Appropriate  Insight:  Good  Engagement in Group:  Good  Additional Comments:  none  Nereida Schepp M 04/02/2012, 12:12 PM

## 2012-04-02 NOTE — Tx Team (Signed)
Interdisciplinary Treatment Plan Update (Adult)  Date:  04/02/2012  Time Reviewed:  10:51 AM   Progress in Treatment: Attending groups: Yes Participating in groups:  Yes Taking medication as prescribed:  Yes Tolerating medication: Yes Family/Significant othe contact made:  No, Eliot refuses to allow contact to be made with supports Patient understands diagnosis: Yes Discussing patient identified problems/goals with staff:  Yes Medical problems stabilized or resolved: Yes Denies suicidal/homicidal ideation: Yes Issues/concerns per patient self-inventory:  No  Other:  New problem(s) identified: None  Reason for Continuation of Hospitalization: Withdrawal Symptoms Medication Stabilization   Interventions implemented related to continuation of hospitalization:  Medication stabilization, safety checks q 15 mins, group attendance  Additional comments:  Estimated length of stay:  3-5 days  Discharge Plan: [name] will discharge to live with boyfriend and follow up with [facility] goal(s):  Review of initial/current patient goals per problem list:   1.  Goal(s):  Detox from benzodiazepines  Met:  No  Target date: by discharge  As evidenced by: Carolyn Vincent is on clonidine detox protocol, and will taper off of that while in hospital  2.  Goal (s): Decrease anxiety symptoms to rating of 4 or less  Met:  No  Target date: by discharge  As evidenced by: Carolyn Vincent rates anxiety at 5  3.  Goal(s): Decrease depressive symptoms to rating of 4 or less  Met:  Yes  Target date: by discharge  As evidenced by: Carolyn Vincent rates depression at 3 today  4.  Goal(s): Reduce potential for suicide/self-harm  Met:  Yes  Target date: by discharge  As evidenced by: Carolyn Vincent denies any suicidal thoughts today  Attendees: Patient:  Carolyn Vincent 04/02/2012 10:51 AM  Family:     Physician:  Dr Orson Aloe, MD 04/02/2012 10:51 AM  Nursing:   Berneice Heinrich, RN 04/02/2012 10:51 AM  Case Manager:   Juline Patch, LCSW 04/02/2012 10:51 AM  Counselor:  Angus Palms, LCSW 04/02/2012 10:51 AM  Other:  Nestor Ramp, RN 04/02/2012 10:51 AM  Other:  Harold Barban, RN 04/02/2012 10:51 AM  Other:  Onnie Boer, RN Case Manager 04/02/2012 10:51 AM  Other:      Scribe for Treatment Team:   Billie Lade, 04/02/2012 10:51 AM

## 2012-04-02 NOTE — Progress Notes (Signed)
Group Note  Date:  04/02/2012 Time:  1:15  Group Topic/Focus:  Emotion Regulation  Participation Level:  Active  Participation Quality:  Appropriate  Affect:  Appropriate  Cognitive:  Appropriate  Insight:  Good  Engagement in Group:  Good  Additional Comments:  Whitnie was actively engaged in group. She answered questions and shared about the difficulties she has had after the death of her husband years ago.  Quintasha Gren S 04/02/2012, 2:32 PM

## 2012-04-02 NOTE — Progress Notes (Signed)
Pt better with stopping the Xanax and placement of Clonidine patch on her.  She appears much calmer and able to see her own involvement in her problems.  She is sort of feeling that she needs to make some hard choices involving her son.  He needs to stand up and take responsibility for himself and she needs to get comfortable with that.  Major codependent issues notedn along with substance dependency issues.  Dan Humphreys, [name] 04/02/2012 7:59 PM

## 2012-04-02 NOTE — Progress Notes (Signed)
Pt reports she is doing better today.  She is trying to come to terms about her son and how he needs to take more responsibility.  As a mother, though, she is still worried about him.  She denies SI/HI/AV.  She prefers to attend group on the 500 hall tonight.  Pt makes her needs known to staff.  She voices no concerns/needs at this time.  Pt intends to return to her home at discharge and follow up with [facility].  Safety maintained with q15 minute checks.

## 2012-04-03 DIAGNOSIS — F329 Major depressive disorder, single episode, unspecified: Secondary | ICD-10-CM | POA: Diagnosis present

## 2012-04-03 MED ORDER — LISINOPRIL-HYDROCHLOROTHIAZIDE 10-12.5 MG PO TABS: 1.0000 | ORAL_TABLET | Freq: Every evening | ORAL | Status: DC

## 2012-04-03 MED ORDER — NICOTINE 21 MG/24HR TD PT24: 1.0000 | MEDICATED_PATCH | Freq: Every day | TRANSDERMAL | Status: DC

## 2012-04-03 MED ORDER — CITALOPRAM HYDROBROMIDE 10 MG PO TABS
10.0000 mg | ORAL_TABLET | Freq: Every day | ORAL | Status: DC
  Administered 2012-04-03: 10 mg via ORAL
  Filled 2012-04-03: qty 1
  Filled 2012-04-03: qty 14
  Filled 2012-04-03: qty 1

## 2012-04-03 MED ORDER — CITALOPRAM HYDROBROMIDE 10 MG PO TABS: 10.0000 mg | ORAL_TABLET | Freq: Every day | ORAL | Status: DC

## 2012-04-03 MED ORDER — HYDROXYZINE HCL 50 MG PO TABS: 50.0000 mg | ORAL_TABLET | Freq: Every evening | ORAL | Status: DC | PRN

## 2012-04-03 NOTE — Tx Team (Signed)
Interdisciplinary Treatment Plan Update (Adult)  Date:  04/03/2012  Time Reviewed:  11:10 AM   Progress in Treatment: Attending groups:   Yes   Participating in groups:  Yes Taking medication as prescribed:  Yes Tolerating medication:  Yes Family/Significant othe contact made:  Patient understands diagnosis:  Yes Discussing patient identified problems/goals with staff: Yes Medical problems stabilized or resolved: Yes Denies suicidal/homicidal ideation:Yes Issues/concerns per patient self-inventory:  Other:  New problem(s) identified:  Reason for Continuation of Hospitalization:  Interventions implemented related to continuation of hospitalization:  Additional comments:  Estimated length of stay:  Discharge home today  Discharge Plan:  Home with outpatient follow up  New goal(s):  Review of initial/current patient goals per problem list:    1.  Goal(s): Eliminate SI/other thoughts of self harm   Met:  Yes  Target date: d/c  As evidenced by:  Patient no longer endorsing SI/HI or other thoughts of self harm.    2.  Goal (s):  Reduce depression/anxiety (rated at one today)   Met:  Yes  Target date: d/c  As evidenced by: Patient currently rating symptoms at four or below    3.  Goal(s): .stabilize on meds   Met:  Yes  Target date: d/c  As evidenced by: Patient reports being stabilized on medications - less symptomatic    4.  Goal(s): Refer for outpatient follow up   Met:  Yes  Target date: d/c  As evidenced by: Follow up appointment scheduled    Attendees: Patient:  Carolyn Vincent 04/03/2012 11:11 AM  Nursing: Nestor Ramp, RN 04/03/2012 11:12 AM\  Physician:  Orson Aloe, MD 04/03/2012 11:10 AM   Nursing:   Charlyne Mom, RN 04/03/2012 11:10 AM   CaseManager:  Juline Patch, LCSW 04/03/2012 11:10 AM   Counselor:  Angus Palms, LCSW 04/03/2012 11:10 AM

## 2012-04-03 NOTE — Progress Notes (Signed)
Patient denies SI/HI, denies A/V hallucinations. Patient verbalizes understanding of discharge instructions, follow up care and prescriptions. Patient given all belongings from [facility] locker. Patient escorted out by staff, transported by family. 
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
## 2012-04-03 NOTE — Tx Team (Signed)
Interdisciplinary Treatment Plan Update (Adult)  Date: 04/03/2012   Time Reviewed: 10:41 AM   Progress in Treatment:   Attending groups: Yes  Participating in groups: Yes  Taking medication as prescribed: Yes  Tolerating medication: Yes  Family/Significant othe contact made: No, Ayisha refuses to allow contact to be made with supports  Patient understands diagnosis: Yes  Discussing patient identified problems/goals with staff: Yes  Medical problems stabilized or resolved: Yes  Denies suicidal/homicidal ideation: Yes  Issues/concerns per patient self-inventory: No  Other:   New problem(s) identified: None   Reason for Continuation of Hospitalization:  Appropriate for discharge today  Interventions implemented related to continuation of hospitalization: Medication stabilization, safety checks q 15 mins, group attendance   Additional comments:   Estimated length of stay: discharge today  Discharge Plan: Lua will discharge to live with boyfriend and follow up with [facility] goal(s):   Review of initial/current patient goals per problem list:  1. Goal(s): Detox from benzodiazepines  Met: Yes Target date: by discharge  As evidenced by: Lynwood Dawley is no longer having withdrawal symptoms from Xanax   2. Goal (s): Decrease anxiety symptoms to rating of 4 or less  Met: Yes Target date: by discharge  As evidenced by: Adella rates anxiety at 1   3. Goal(s): Decrease depressive symptoms to rating of 4 or less  Met: Yes  Target date: by discharge  As evidenced by: Verginia rates depression at 1    4. Goal(s): Reduce potential for suicide/self-harm  Met: Yes  Target date: by discharge  As evidenced by: Arienna denies any suicidal thoughts today  Attendees:  Patient: Carolyn Vincent  04/03/2012 10:40 AM  Family:    Physician: Dr Orson Aloe, MD  04/03/2012 10:40 AM  Nursing: Nestor Ramp, RN  04/03/2012 10:40 AM  Case Manager: Juline Patch, LCSW  04/03/2012 10:40 AM   Counselor: Angus Palms, LCSW  04/03/2012 10:40 AM  Other: Charlyne Mom, RN  04/03/2012 10:40 AM  Other:     Other:     Other:    Scribe for Treatment Team:  Billie Lade, 04/03/2012 10:41 AM

## 2012-04-03 NOTE — [facility] Suicide Risk Assessment (Signed)
Suicide Risk Assessment  Discharge Assessment     Current Mental Status by Physician: Patient denies suicidal or homicidal ideation, hallucinations, illusions, or delusions. Patient engages with good eye contact, is able to focus adequately in a one to one setting, and has clear goal directed thoughts. Patient speaks with a natural conversational volume, rate, and tone. Anxiety was reported at 2 on a scale of 1 the least and 10 the most. Depression was reported at 2 on the same scale. Patient is oriented times 4, recent and remote memory intact. 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  AXIS II:  Deferred AXIS III:   Past Medical History  Diagnosis Date  . Hypertension   . Tendonitis of ankle or foot 03/30/2012    Left side  . Asthma 03/30/2012    Reports Hx of "asthma attacks" but not asthma.  . Chest pain 03/30/2012    Recurrent; scales at 5 of 10 at this time (12:45)   AXIS IV:  other psychosocial or environmental problems AXIS V:  51-60 moderate symptoms  Cognitive Features That Contribute To Risk:  Thought constriction (tunnel vision)    Suicide Risk:  Minimal: No identifiable suicidal ideation.  Patients presenting with no risk factors but with morbid ruminations; may be classified as minimal risk based on the severity of the depressive symptoms  Labs:  Results for orders placed during the hospital encounter of 04/01/12 (from the past 72 hour(s))  TSH     Status: Normal   Collection Time   04/02/12  6:25 AM      Component Value Range Comment   TSH 1.489  0.350 - 4.500 uIU/mL     RISK REDUCTION FACTORS: What pt has learned from hospital stay is that they need to use their judgement before they use medications, as they amplify problems.   Risk of self harm is elevated by her depression and anxiety and use of addictive substances.  Risk of harm to others is minimal in that she has not been involved in fights or had any legal charges filed on her.  Pt seen in treatment team where she divulged the above information. The treatment team concluded that she was ready for discharge and had met her goals for an inpatient setting.  PLAN: Discharge home Continue   Medication List     As of 04/03/2012  1:49 PM    STOP taking these medications         ALPRAZolam 1 MG tablet   Commonly known as: XANAX      indomethacin 25 MG capsule   Commonly known as: INDOCIN      TAKE these medications      Indication    citalopram 10 MG tablet   Commonly known as: CELEXA   Take 1 tablet (10 mg total) by mouth daily. For depression.       hydrOXYzine 50 MG tablet   Commonly known as: ATARAX/VISTARIL   Take 1 tablet (50 mg total) by mouth at bedtime and may repeat dose one time if needed. For insomnia.       lisinopril-hydrochlorothiazide 10-12.5 MG per tablet   Commonly known as:  PRINZIDE,ZESTORETIC   Take 1 tablet by mouth every evening. For control of high blood pressure       nicotine 21 mg/24hr patch   Commonly known as: NICODERM CQ - dosed in mg/24 hours   Place 1 patch onto the skin daily. For smoking cessation. And costs less than 1 month at a pack a day        Follow-up recommendations:  Activities: Resume typical activities Diet: Resume typical diet Tests: none Other: Follow up with outpatient provider and report any side effects to out patient prescriber.  Is patient on multiple antipsychotic therapies at discharge:  No  Has Patient had three or more failed trials of antipsychotic monotherapy by history: N/A Recommended Plan for Multiple Antipsychotic  Therapies: N/A  Dan Humphreys, Narek Kniss 04/03/2012 1:49 PM

## 2012-04-04 NOTE — Progress Notes (Signed)
Patient Discharge Instructions:  After Visit Summary (AVS):   Faxed to:  04/04/12 Discharge Summary Note:   Faxed to:  04/04/12 Psychiatric Admission Assessment Note:   Faxed to:  04/04/12 Suicide Risk Assessment - Discharge Assessment:   Faxed to:  04/04/12 Faxed/Sent to the Next Level Care provider:  04/04/12  Faxed to [facility] @ [phone]  Karleen Hampshire Brittini, 04/04/2012, 12:32 PM
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
## 2013-07-08 ENCOUNTER — Encounter (HOSPITAL_COMMUNITY): Payer: Self-pay | Admitting: Emergency Medicine

## 2013-07-08 ENCOUNTER — Emergency Department (HOSPITAL_COMMUNITY)
Admission: EM | Admit: 2013-07-08 | Discharge: 2013-07-08 | Disposition: A | Discharge status: Home / Self Care | Payer: Self-pay | Attending: Emergency Medicine | Admitting: Emergency Medicine

## 2013-07-08 DIAGNOSIS — M799 Soft tissue disorder, unspecified: Secondary | ICD-10-CM | POA: Insufficient documentation

## 2013-07-08 DIAGNOSIS — M545 Low back pain, unspecified: Secondary | ICD-10-CM | POA: Insufficient documentation

## 2013-07-08 DIAGNOSIS — R110 Nausea: Secondary | ICD-10-CM | POA: Insufficient documentation

## 2013-07-08 DIAGNOSIS — T148XXA Other injury of unspecified body region, initial encounter: Secondary | ICD-10-CM

## 2013-07-08 DIAGNOSIS — I10 Essential (primary) hypertension: Secondary | ICD-10-CM | POA: Insufficient documentation

## 2013-07-08 DIAGNOSIS — M5410 Radiculopathy, site unspecified: Secondary | ICD-10-CM

## 2013-07-08 DIAGNOSIS — F17200 Nicotine dependence, unspecified, uncomplicated: Secondary | ICD-10-CM | POA: Insufficient documentation

## 2013-07-08 DIAGNOSIS — IMO0002 Reserved for concepts with insufficient information to code with codable children: Secondary | ICD-10-CM | POA: Insufficient documentation

## 2013-07-08 DIAGNOSIS — J45909 Unspecified asthma, uncomplicated: Secondary | ICD-10-CM | POA: Insufficient documentation

## 2013-07-08 MED ORDER — CYCLOBENZAPRINE HCL 10 MG PO TABS: 10.0000 mg | ORAL_TABLET | Freq: Two times a day (BID) | ORAL | Status: DC | PRN

## 2013-07-08 MED ORDER — ACETAMINOPHEN 500 MG PO TABS
1000.0000 mg | ORAL_TABLET | Freq: Once | ORAL | Status: AC
  Administered 2013-07-08: 1000 mg via ORAL
  Filled 2013-07-08: qty 2

## 2013-07-08 MED ORDER — HYDROCODONE-ACETAMINOPHEN 5-325 MG PO TABS: ORAL_TABLET | ORAL | Status: DC

## 2013-07-08 NOTE — ED Provider Notes (Signed)
Medical screening examination/treatment/procedure(s) were performed by non-physician practitioner and as supervising physician I was immediately available for consultation/collaboration.  EKG Interpretation   None        Merryl Hacker, MD 07/08/13 1606

## 2013-07-08 NOTE — ED Notes (Signed)
Pt reports left sided back pain. Pt denies injury. Pt has bruise to left flank. States pain radiating down left leg now.

## 2013-07-08 NOTE — ED Provider Notes (Signed)
CSN: [id number]     Arrival date & time 07/08/13  1108 History  This chart was scribed for non-physician practitioner Noland Fordyce, PA-C working with Merryl Hacker, MD by Rolanda Lundborg, ED Scribe. This patient was seen in room TR07C/TR07C and the patient's care was started at 11:16 AM.    Chief Complaint  Patient presents with  . Back Pain   The history is provided by the patient. No language interpreter was used.   HPI Comments: Carolyn Vincent is a 58 y.o. female who presents to the Emergency Department complaining of worsening left lower back pain onset 2 weeks ago that radiates down her left buttock, left leg, and into her left ankle. She states she fell towards the end of December. She states sitting still makes the pain worse and she is unable to sleep at night. She states she woke up with a bruise to her left lower side this morning but does not remember bumping into anything. She has been taking Advil with no relief. She denies h/o similar pain. She reports nausea from pain but no vomiting. She denies fevers.   Past Medical History  Diagnosis Date  . Hypertension   . Tendonitis of ankle or foot 03/30/2012    Left side  . Asthma 03/30/2012    Reports Hx of "asthma attacks" but not asthma.  . Chest pain 03/30/2012    Recurrent; scales at 5 of 10 at this time (12:45)   Past Surgical History  Procedure Laterality Date  . Abdominal hysterectomy     No family history on file. History  Substance Use Topics  . Smoking status: Current Every Day Smoker -- 1.00 packs/day for 40 years    Types: Cigarettes  . Smokeless tobacco: Never Used  . Alcohol Use: Yes     Comment: occ   OB History   Grav Para Term Preterm Abortions TAB SAB Ect Mult Living                 Review of Systems  Constitutional: Negative for fever.  Gastrointestinal: Positive for nausea. Negative for vomiting.  Musculoskeletal: Positive for back pain.  All other systems reviewed and are  negative.    Allergies  Benzodiazepines; Indomethacin; Codeine; and Sulfa antibiotics  Home Medications   Current Outpatient Rx  Name  Route  Sig  Dispense  Refill  . lisinopril-hydrochlorothiazide (PRINZIDE,ZESTORETIC) 10-12.5 MG per tablet   Oral   Take 1 tablet by mouth at bedtime.         . cyclobenzaprine (FLEXERIL) 10 MG tablet   Oral   Take 1 tablet (10 mg total) by mouth 2 (two) times daily as needed for muscle spasms.   20 tablet   0   . HYDROcodone-acetaminophen (NORCO/VICODIN) 5-325 MG per tablet      Take 1-2 tabs every 4-6 hours as needed for pain.   10 tablet   0    BP 106/68  Temp(Src) 98 F (36.7 C) (Oral)  Resp 16  SpO2 97%  LMP 06/25/1993 Physical Exam  Nursing note and vitals reviewed. Constitutional: She appears well-developed and well-nourished. No distress.  HENT:  Head: Normocephalic and atraumatic.  Eyes: Conjunctivae are normal. No scleral icterus.  Neck: Normal range of motion.  Cardiovascular: Normal rate, regular rhythm and normal heart sounds.   Pulmonary/Chest: Effort normal and breath sounds normal. No respiratory distress. She has no wheezes. She has no rales. She exhibits no tenderness.  Abdominal: Soft. Bowel sounds are normal. She exhibits  no distension and no mass. There is no tenderness. There is no rebound and no guarding.  Musculoskeletal: Normal range of motion.  Ttp left lower musculature, left buttock, and left anterior thigh. No spinal tenderness, step-offs, crepitus. Antalgic gait.   Neurological: She is alert.  Skin: Skin is warm and dry. She is not diaphoretic.  4 cm area of ecchymosis on left lower side.    ED Course  Procedures (including critical care time) Medications  acetaminophen (TYLENOL) tablet 1,000 mg (1,000 mg Oral Given 07/08/13 1248)    DIAGNOSTIC STUDIES: Oxygen Saturation is 97% on RA, normal by my interpretation.    COORDINATION OF CARE: 12:28 PM- Discussed treatment plan with pt which  includes pain medication. Pt agrees to plan.    Labs Review Labs Reviewed - No data to display Imaging Review No results found.  EKG Interpretation   None       MDM   1. Lower back pain   2. Radicular pain of left lower extremity   3. Bruise    Left lower back pain and left leg pain appears to be sciatic nerve distribution.  Pt is driving home. Will treat with acetaminophen in ED.  Rx: vicodin and flexeril. Advised to f/u next week with PCP, ortho resource provided as well. Return precautions given. Pt verbalized understanding and agreement with tx plan.  I personally performed the services described in this documentation, which was scribed in my presence. The recorded information has been reviewed and is accurate.    Noland Fordyce, PA-C 07/08/13 1310

## 2013-07-08 NOTE — ED Notes (Addendum)
Pt has a bruise to the left back.  Pt denies any knowledge of injury.  Pt c/o L back pain ongoing x 2 weeks.

## 2014-05-13 ENCOUNTER — Emergency Department (HOSPITAL_COMMUNITY)
Admission: EM | Admit: 2014-05-13 | Discharge: 2014-05-13 | Disposition: A | Discharge status: Home / Self Care | Payer: Self-pay | Attending: Emergency Medicine | Admitting: Emergency Medicine

## 2014-05-13 ENCOUNTER — Emergency Department (HOSPITAL_COMMUNITY): Payer: Self-pay

## 2014-05-13 ENCOUNTER — Encounter (HOSPITAL_COMMUNITY): Payer: Self-pay | Admitting: *Deleted

## 2014-05-13 DIAGNOSIS — Z79899 Other long term (current) drug therapy: Secondary | ICD-10-CM | POA: Insufficient documentation

## 2014-05-13 DIAGNOSIS — Z720 Tobacco use: Secondary | ICD-10-CM | POA: Insufficient documentation

## 2014-05-13 DIAGNOSIS — Z8739 Personal history of other diseases of the musculoskeletal system and connective tissue: Secondary | ICD-10-CM | POA: Insufficient documentation

## 2014-05-13 DIAGNOSIS — R079 Chest pain, unspecified: Secondary | ICD-10-CM

## 2014-05-13 DIAGNOSIS — R000 Tachycardia, unspecified: Secondary | ICD-10-CM | POA: Insufficient documentation

## 2014-05-13 DIAGNOSIS — R0789 Other chest pain: Secondary | ICD-10-CM | POA: Insufficient documentation

## 2014-05-13 DIAGNOSIS — J45909 Unspecified asthma, uncomplicated: Secondary | ICD-10-CM | POA: Insufficient documentation

## 2014-05-13 DIAGNOSIS — I10 Essential (primary) hypertension: Secondary | ICD-10-CM | POA: Insufficient documentation

## 2014-05-13 LAB — BASIC METABOLIC PANEL
ANION GAP: 16 — AB (ref 5–15)
BUN: 10 mg/dL (ref 6–23)
CALCIUM: 9.2 mg/dL (ref 8.4–10.5)
CHLORIDE: 101 meq/L (ref 96–112)
CO2: 25 meq/L (ref 19–32)
CREATININE: 0.71 mg/dL (ref 0.50–1.10)
GFR calc Af Amer: >90 mL/min (ref >90)
GFR calc non Af Amer: >90 mL/min (ref >90)
GLUCOSE: 109 mg/dL — AB (ref 70–99)
Potassium: 3.4 mEq/L — ABNORMAL LOW (ref 3.7–5.3)
Sodium: 142 mEq/L (ref 137–147)

## 2014-05-13 LAB — CBC
HEMATOCRIT: 38.9 % (ref 36.0–46.0)
HEMOGLOBIN: 13.6 g/dL (ref 12.0–15.0)
MCH: 31.7 pg (ref 26.0–34.0)
MCHC: 35 g/dL (ref 30.0–36.0)
MCV: 90.7 fL (ref 78.0–100.0)
Platelets: 333 10*3/uL (ref 150–400)
RBC: 4.29 MIL/uL (ref 3.87–5.11)
RDW: 13.6 % (ref 11.5–15.5)
WBC: 8.5 10*3/uL (ref 4.0–10.5)

## 2014-05-13 LAB — I-STAT TROPONIN, ED: TROPONIN I, POC: 0 ng/mL (ref 0.00–0.08)

## 2014-05-13 LAB — TROPONIN I: Troponin I: <0.3 ng/mL (ref <0.30)

## 2014-05-13 LAB — D-DIMER, QUANTITATIVE: D-Dimer, Quant: 0.92 ug/mL-FEU — ABNORMAL HIGH (ref 0.00–0.48)

## 2014-05-13 MED ORDER — SODIUM CHLORIDE 0.9 % IV BOLUS (SEPSIS)
1000.0000 mL | Freq: Once | INTRAVENOUS | Status: AC
  Administered 2014-05-13: 1000 mL via INTRAVENOUS

## 2014-05-13 MED ORDER — ONDANSETRON 4 MG PO TBDP
4.0000 mg | ORAL_TABLET | Freq: Once | ORAL | Status: AC
  Administered 2014-05-13: 4 mg via ORAL
  Filled 2014-05-13: qty 1

## 2014-05-13 MED ORDER — OXYCODONE-ACETAMINOPHEN 5-325 MG PO TABS
2.0000 | ORAL_TABLET | Freq: Once | ORAL | Status: AC
  Administered 2014-05-13: 2 via ORAL
  Filled 2014-05-13 (×2): qty 2

## 2014-05-13 MED ORDER — IBUPROFEN 800 MG PO TABS: 800.0000 mg | ORAL_TABLET | Freq: Three times a day (TID) | ORAL | Status: DC | PRN

## 2014-05-13 MED ORDER — IOHEXOL 350 MG/ML SOLN
100.0000 mL | Freq: Once | INTRAVENOUS | Status: AC | PRN
  Administered 2014-05-13: 100 mL via INTRAVENOUS

## 2014-05-13 MED ORDER — IBUPROFEN 800 MG PO TABS
800.0000 mg | ORAL_TABLET | Freq: Once | ORAL | Status: AC
  Administered 2014-05-13: 800 mg via ORAL
  Filled 2014-05-13: qty 1

## 2014-05-13 MED ORDER — PROMETHAZINE HCL 25 MG PO TABS: 25.0000 mg | ORAL_TABLET | Freq: Four times a day (QID) | ORAL | Status: DC | PRN

## 2014-05-13 MED ORDER — CYCLOBENZAPRINE HCL 5 MG PO TABS: 5.0000 mg | ORAL_TABLET | Freq: Three times a day (TID) | ORAL | Status: DC | PRN

## 2014-05-13 MED ORDER — OXYCODONE-ACETAMINOPHEN 5-325 MG PO TABS: 1.0000 | ORAL_TABLET | ORAL | Status: DC | PRN

## 2014-05-13 MED ORDER — CYCLOBENZAPRINE HCL 10 MG PO TABS
5.0000 mg | ORAL_TABLET | Freq: Once | ORAL | Status: AC
  Administered 2014-05-13: 5 mg via ORAL
  Filled 2014-05-13: qty 1

## 2014-05-13 NOTE — ED Notes (Signed)
Pt returned from CT °

## 2014-05-13 NOTE — ED Provider Notes (Signed)
TIME SEEN: 3:10 PM  CHIEF COMPLAINT: chest pain  HPI: Pt is a 58 y.o. F with history of hypertension, asthma, tobacco use who presents emergency room with left sharp chest pain that is worse with movement and deep inspiration. She states that she has had a cough for the past several weeks and recently finished a Z-Pak for bronchitis. She reports her cough is getting better. Denies fevers or chills. No lower extremity swelling or pain. No shortness of breath. She did have nausea and vomiting yesterday but does not think this was related to the pain. No diaphoresis or dizziness. 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 Denies any history of PE or DVT, lower extreme swelling or pain, recent prolonged immobilization such as long flight or hospitals edition, fracture, surgery, trauma. No prior history of coronary artery disease. Patient reports her pain is gone if she stays completely still.  Pain does not radiate.  ROS: See HPI Constitutional: no fever  Eyes: no drainage  ENT: no runny nose   Cardiovascular:  chest pain  Resp: no SOB  GI: no vomiting GU: no dysuria Integumentary: no rash  Allergy: no hives  Musculoskeletal: no leg swelling  Neurological: no slurred speech ROS otherwise negative  PAST MEDICAL HISTORY/PAST SURGICAL HISTORY:  Past Medical History  Diagnosis Date  . Hypertension   . Tendonitis of ankle or foot 03/30/2012    Left side  . Asthma 03/30/2012    Reports Hx of "asthma attacks" but not asthma.  . Chest pain 03/30/2012    Recurrent; scales at 5 of 10 at this time (12:45)    MEDICATIONS:  Prior to Admission medications   Medication Sig Start Date End Date Taking? Authorizing Provider  cyclobenzaprine (FLEXERIL) 10 MG tablet Take 1 tablet (10 mg total) by mouth 2 (two) times daily as needed for muscle spasms. 07/08/13   Noland Fordyce, PA-C   HYDROcodone-acetaminophen (NORCO/VICODIN) 5-325 MG per tablet Take 1-2 tabs every 4-6 hours as needed for pain. 07/08/13   Noland Fordyce, PA-C  lisinopril-hydrochlorothiazide (PRINZIDE,ZESTORETIC) 10-12.5 MG per tablet Take 1 tablet by mouth at bedtime.    Historical Provider, MD    ALLERGIES:  Allergies  Allergen Reactions  . Benzodiazepines Other (See Comments)    Pt noted memory problems and disinhibition and depression on Xanax  . Indomethacin Anaphylaxis  . Codeine Nausea And Vomiting  . Sulfa Antibiotics Nausea And Vomiting    Violently ill    SOCIAL HISTORY:  History  Substance Use Topics  . Smoking status: Current Every Day Smoker -- 1.00 packs/day for 40 years    Types: Cigarettes  . Smokeless tobacco: Never Used  . Alcohol Use: Yes     Comment: occ    FAMILY HISTORY: No family history on file.  EXAM: BP 98/75 mmHg  Pulse 99  Temp(Src) 97 F (36.1 C) (Oral)  Resp 20  Ht 5\' 2"  (1.575 m)  Wt 125 lb (56.7 kg)  BMI 22.86 kg/m2  SpO2 97%  LMP 06/25/1993 CONSTITUTIONAL: Alert and oriented and responds appropriately to questions. Well-appearing; well-nourished; appears uncomfortable but nontoxic HEAD: Normocephalic EYES: Conjunctivae clear, PERRL ENT: normal nose; no rhinorrhea; moist mucous membranes; pharynx without lesions noted NECK: Supple, no meningismus, no LAD  CARD: RRR; S1 and S2 appreciated; no murmurs, no clicks, no rubs, no gallops CHEST:  Patient is tender to  palpation over her left chest wall and this reproduces her pain but there is no lesions noted, no crepitus or ecchymosis or deformity RESP: Normal chest excursion without splinting or tachypnea; breath sounds clear and equal bilaterally; no wheezes, no rhonchi, no rales; no hypoxia or respiratory distress, speaking full sentences ABD/GI: Normal bowel sounds; non-distended; soft, non-tender, no rebound, no guarding BACK:  The back appears normal and is non-tender to palpation, there is no CVA  tenderness EXT: Normal ROM in all joints; non-tender to palpation; no edema; normal capillary refill; no cyanosis; no calf tenderness or swelling; equal pulses in all of her extremities   SKIN: Normal color for age and race; warm NEURO: Moves all extremities equally PSYCH: The patient's mood and manner are appropriate. Grooming and personal hygiene are appropriate.  MEDICAL DECISION MAKING: Pt here with chest pain.  He has a history of hypertension and tobacco use but no history of coronary artery disease. EKG shows no ischemic changes. First troponin negative. Chest x-ray clear. Pain is completely reproducible with palpation of her chest wall. Suspect that this is musculoskeletal in nature. Will give Flexeril, Percocet and ibuprofen. We'll repeat a second troponin given she does have risk factors for ACS although I feel this is less likely. She has no risk factors for pulmonary embolus given she is tachycardic and appears uncomfortable and reports is getting worse instead of better, will obtain a d-dimer.  ED PROGRESS: D dimer elevated. Will obtain a CT of her chest to rule out pulmonary embolus.   6:53 PM  Pt's CT scans shows no thoracic dissection, pulmonary embolus. She does have changes consistent with emphysema. She reports feeling much better after ibuprofen, Percocet, Flexeril. Suspect chest wall pain. Second troponin is negative. We'll discharge him with return precautions, supportive care instructions and prescriptions for the above medications. Patient verbalizes understanding is comfortable with plan.      EKG Interpretation  Date/Time:  Thursday May 13 2014 12:14:43 EST Ventricular Rate:  101 PR Interval:  114 QRS Duration: 80 QT Interval:  330 QTC Calculation: 427 R Axis:   61 Text Interpretation:  Sinus tachycardia Possible Left atrial enlargement Borderline ECG Confirmed by WARD,  DO, KRISTEN ([id number]) on 05/13/2014 3:04:00 PM        [place], DO 05/13/14  0301

## 2014-05-13 NOTE — Discharge Instructions (Signed)

## 2014-05-13 NOTE — ED Notes (Signed)
Pt c/o pain immediately beneath L breast.  Pain increases with inspiration.  Took half tx of doxy after being tx for bronchitis (also finished Z-pack).  Pt was loading truck for 4 hours.  Monday began experiencing pain.  Also c/o sob.

## 2014-05-13 NOTE — ED Notes (Signed)
Dr. Ward at bedside.

## 2016-01-17 ENCOUNTER — Encounter (HOSPITAL_COMMUNITY): Payer: Self-pay | Admitting: Vascular Surgery

## 2016-01-17 ENCOUNTER — Emergency Department (HOSPITAL_COMMUNITY)
Admission: EM | Admit: 2016-01-17 | Discharge: 2016-01-17 | Disposition: A | Discharge status: Home / Self Care | Payer: Self-pay | Attending: Emergency Medicine | Admitting: Emergency Medicine

## 2016-01-17 ENCOUNTER — Emergency Department (HOSPITAL_COMMUNITY): Payer: Self-pay

## 2016-01-17 DIAGNOSIS — I739 Peripheral vascular disease, unspecified: Secondary | ICD-10-CM | POA: Insufficient documentation

## 2016-01-17 DIAGNOSIS — F17210 Nicotine dependence, cigarettes, uncomplicated: Secondary | ICD-10-CM | POA: Insufficient documentation

## 2016-01-17 DIAGNOSIS — I10 Essential (primary) hypertension: Secondary | ICD-10-CM | POA: Insufficient documentation

## 2016-01-17 DIAGNOSIS — J45909 Unspecified asthma, uncomplicated: Secondary | ICD-10-CM | POA: Insufficient documentation

## 2016-01-17 DIAGNOSIS — Z720 Tobacco use: Secondary | ICD-10-CM

## 2016-01-17 DIAGNOSIS — R1012 Left upper quadrant pain: Secondary | ICD-10-CM | POA: Insufficient documentation

## 2016-01-17 LAB — COMPREHENSIVE METABOLIC PANEL
ALBUMIN: 4 g/dL (ref 3.5–5.0)
ALK PHOS: 75 U/L (ref 38–126)
ALT: 14 U/L (ref 14–54)
ANION GAP: 8 (ref 5–15)
AST: 16 U/L (ref 15–41)
BILIRUBIN TOTAL: 0.5 mg/dL (ref 0.3–1.2)
BUN: 10 mg/dL (ref 6–20)
CALCIUM: 9.5 mg/dL (ref 8.9–10.3)
CO2: 28 mmol/L (ref 22–32)
CREATININE: 0.67 mg/dL (ref 0.44–1.00)
Chloride: 102 mmol/L (ref 101–111)
GFR calc non Af Amer: >60 mL/min (ref >60)
GLUCOSE: 95 mg/dL (ref 65–99)
Potassium: 3.7 mmol/L (ref 3.5–5.1)
Sodium: 138 mmol/L (ref 135–145)
TOTAL PROTEIN: 6.5 g/dL (ref 6.5–8.1)

## 2016-01-17 LAB — CBC
HCT: 41.9 % (ref 36.0–46.0)
HEMOGLOBIN: 14.3 g/dL (ref 12.0–15.0)
MCH: 30.5 pg (ref 26.0–34.0)
MCHC: 34.1 g/dL (ref 30.0–36.0)
MCV: 89.3 fL (ref 78.0–100.0)
PLATELETS: 333 10*3/uL (ref 150–400)
RBC: 4.69 MIL/uL (ref 3.87–5.11)
RDW: 13.7 % (ref 11.5–15.5)
WBC: 8.2 10*3/uL (ref 4.0–10.5)

## 2016-01-17 LAB — URINALYSIS, ROUTINE W REFLEX MICROSCOPIC
BILIRUBIN URINE: NEGATIVE
Glucose, UA: NEGATIVE mg/dL
Hgb urine dipstick: NEGATIVE
KETONES UR: NEGATIVE mg/dL
Leukocytes, UA: NEGATIVE
NITRITE: NEGATIVE
PROTEIN: NEGATIVE mg/dL
SPECIFIC GRAVITY, URINE: 1.011 (ref 1.005–1.030)
pH: 7 (ref 5.0–8.0)

## 2016-01-17 LAB — LIPASE, BLOOD: Lipase: 29 U/L (ref 11–51)

## 2016-01-17 MED ORDER — ONDANSETRON HCL 4 MG/2ML IJ SOLN
4.0000 mg | Freq: Once | INTRAMUSCULAR | Status: AC
  Administered 2016-01-17: 4 mg via INTRAVENOUS
  Filled 2016-01-17: qty 2

## 2016-01-17 MED ORDER — SODIUM CHLORIDE 0.9 % IV BOLUS (SEPSIS)
1000.0000 mL | Freq: Once | INTRAVENOUS | Status: AC
  Administered 2016-01-17: 1000 mL via INTRAVENOUS

## 2016-01-17 MED ORDER — DICYCLOMINE HCL 20 MG PO TABS: 20.0000 mg | ORAL_TABLET | Freq: Two times a day (BID) | ORAL | 0 refills | Status: DC

## 2016-01-17 MED ORDER — IOPAMIDOL (ISOVUE-300) INJECTION 61%
INTRAVENOUS | Status: AC
  Administered 2016-01-17: 100 mL
  Filled 2016-01-17: qty 100

## 2016-01-17 MED ORDER — ONDANSETRON HCL 4 MG PO TABS: 4.0000 mg | ORAL_TABLET | Freq: Four times a day (QID) | ORAL | 0 refills | Status: DC

## 2016-01-17 MED ORDER — DIPHENOXYLATE-ATROPINE 2.5-0.025 MG PO TABS: 1.0000 | ORAL_TABLET | Freq: Four times a day (QID) | ORAL | 0 refills | Status: DC | PRN

## 2016-01-17 MED ORDER — HYDROMORPHONE HCL 1 MG/ML IJ SOLN
0.5000 mg | Freq: Once | INTRAMUSCULAR | Status: AC
  Administered 2016-01-17: 0.5 mg via INTRAVENOUS
  Filled 2016-01-17: qty 1

## 2016-01-17 MED FILL — DICYCLOMINE 20 MG TABLET: 20 | 10 days supply | Qty: 20 | Fill #0

## 2016-01-17 MED FILL — ONDANSETRON HCL 4 MG TABLET: 4 | 3 days supply | Qty: 12 | Fill #0

## 2016-01-17 NOTE — Discharge Planning (Signed)
Spoke to patient regarding primary care resources and the [facility] orange card. Patient screened as a potential candidate for the [facility] orange card but did not qualify due to residency. Follow up appointment made with [facility] to establish primary care for Sept 18,2017 @ 8:45am, pt verbalized understanding of the upcoming appointment. My contact information provided for any future questions or concerns. No other [facility] needs identified at this time.

## 2016-01-17 NOTE — ED Notes (Signed)
Pt statesshe understands instructionbs. Home stable with family and with steady gait.

## 2016-01-17 NOTE — ED Triage Notes (Signed)
Pt reports to the ED for eval of abd pain, bloating, nausea, and diarrhea x 2 weeks. Pt reports she lifts heavy objects often so she is unsure if she has a hernia or not. Pt denies any active vomiting. Pt A&Ox4, resp e/u, and skin warm and dry.

## 2016-01-17 NOTE — ED Notes (Signed)
Patient transported to CT 

## 2016-01-17 NOTE — ED Provider Notes (Addendum)
[facility] DEPT Provider Note   CSN: [id number] Arrival date & time: 01/17/16  0730  First Provider Contact:  None       History   Chief Complaint Chief Complaint  Patient presents with  . Abdominal Pain    HPI Carolyn Vincent is a 60 y.o. female who presents to ED with abdominal pain and diarrhea. Patient has had left upper and lower quadrant pain that is a constant dull ache and has gotten worse over the past 2 weeks. It occasionally radiates to the right upper quadrant and left flank. Patient has also had diarrhea with every bowel movement for the past month. Pain is decreased when the patient lays flat on her back with limited movement. The pain gets worse throughout the day after standing for long periods of time. Patient works at [employer] and is up on her feet and lifting crates for long periods of time. Patient has begun vomiting in the past few days and complains that the pain is getting worse. She complains of some SOB and CP that is related to the abdominal pain. Patient denies fever, chills, blood in stool or vomit, dysuria, or frequency in urination.  HPI  Past Medical History:  Diagnosis Date  . Asthma 03/30/2012   Reports Hx of "asthma attacks" but not asthma.  . Chest pain 03/30/2012   Recurrent; scales at 5 of 10 at this time (12:45)  . Hypertension   . Tendonitis of ankle or foot 03/30/2012   Left side    Patient Active Problem List   Diagnosis Date Noted  . Major depression ([place]) 04/03/2012    Class: Chronic  . 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 ABDOMINAL HYSTERECTOMY      OB History    No data available       Home Medications    Prior to Admission medications   Medication Sig Start Date End Date Taking? Authorizing Provider  cyclobenzaprine (FLEXERIL) 5 MG tablet  Take 1 tablet (5 mg total) by mouth 3 (three) times daily as needed for muscle spasms. 05/13/14   Kristen N Ward, DO  dicyclomine (BENTYL) 20 MG tablet Take 1 tablet (20 mg total) by mouth 2 (two) times daily. 01/17/16   Margarita Mail, PA-C  diphenoxylate-atropine (LOMOTIL) 2.5-0.025 MG tablet Take 1 tablet by mouth 4 (four) times daily as needed for diarrhea or loose stools. 01/17/16   Margarita Mail, PA-C  ibuprofen (ADVIL,MOTRIN) 800 MG tablet Take 1 tablet (800 mg total) by mouth every 8 (eight) hours as needed for mild pain. 05/13/14   Kristen N Ward, DO  lisinopril-hydrochlorothiazide (PRINZIDE,ZESTORETIC) 10-12.5 MG per tablet Take 1 tablet by mouth at bedtime.    Historical Provider, MD  ondansetron (ZOFRAN) 4 MG tablet Take 1 tablet (4 mg total) by mouth every 6 (six) hours. 01/17/16   Margarita Mail, PA-C  oxyCODONE-acetaminophen (PERCOCET/ROXICET) 5-325 MG per tablet Take 1 tablet by mouth every 4 (four) hours as needed. 05/13/14   Kristen N Ward, DO  promethazine (PHENERGAN) 25 MG tablet Take 1 tablet (25 mg total) by mouth every 6 (six) hours as needed for nausea or vomiting. 05/13/14   [place], DO    Family History History reviewed. No pertinent family history.  Social History Social History  Substance Use Topics  . Smoking status:  Current Every Day Smoker    Packs/day: 1.00    Years: 40.00    Types: Cigarettes  . Smokeless tobacco: Never Used  . Alcohol use Yes     Comment: occ     Allergies   Benzodiazepines; Indomethacin; Sulfa antibiotics; and Codeine   Review of Systems Review of Systems  Ten systems reviewed and are negative for acute change, except as noted in the HPI.    Physical Exam Updated Vital Signs BP 98/72   Pulse 70   Temp 97.6 F (36.4 C)   Resp 18   LMP 06/25/1993   SpO2 98%   Physical Exam  Constitutional: She is oriented to person, place, and time. She appears well-developed and well-nourished. No distress.  HENT:  Head:  Normocephalic and atraumatic.  Eyes: Conjunctivae are normal. No scleral icterus.  Neck: Normal range of motion.  Cardiovascular: Normal rate, regular rhythm and normal heart sounds.  Exam reveals no gallop and no friction rub.   No murmur heard. Pulmonary/Chest: Effort normal and breath sounds normal. No respiratory distress.  Abdominal: Soft. Bowel sounds are normal. She exhibits no distension and no mass. There is tenderness in the left upper quadrant. There is no guarding.    Neurological: She is alert and oriented to person, place, and time.  Skin: Skin is warm and dry. She is not diaphoretic.  Nursing note and vitals reviewed.    ED Treatments / Results  Labs (all labs ordered are listed, but only abnormal results are displayed) Labs Reviewed  LIPASE, BLOOD  COMPREHENSIVE METABOLIC PANEL  CBC  URINALYSIS, ROUTINE W REFLEX MICROSCOPIC (NOT AT [facility])    EKG  EKG Interpretation None       Radiology Ct Abdomen Pelvis W Contrast  Addendum Date: 01/17/2016   ADDENDUM REPORT: 01/17/2016 12:33 ADDENDUM: There is aortic atherosclerosis. Electronically Signed   By: Lowella Grip III M.D.   On: 01/17/2016 12:33  Result Date: 01/17/2016 CLINICAL DATA:  Two week history of abdominal pain/ pressure with nausea and vomiting EXAM: CT ABDOMEN AND PELVIS WITH CONTRAST TECHNIQUE: Multidetector CT imaging of the abdomen and pelvis was performed using the standard protocol following bolus administration of intravenous contrast. CONTRAST:  167mL ISOVUE-300 IOPAMIDOL (ISOVUE-300) INJECTION 61% COMPARISON:  CT abdomen and pelvis May 28, 2013 ; ultrasound right upper quadrant May 28, 2013 FINDINGS: Lower chest: There is scarring in the posterior right lung base. Lung bases otherwise are clear. Hepatobiliary: Liver measures 19.2 cm in length. There is an 8 mm focus of decreased attenuation in the anterior segment right lobe of the liver which probably represents a small hemangioma based  on the appearance on prior ultrasound. Note that this lesion is not seen on delayed imaging, supportive of hemangioma. No other focal liver lesions are identified. 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 Gallbladder wall is not appreciably thickened. No biliary duct dilatation is evident. Pancreas: No pancreatic mass or inflammatory focus. Spleen: No splenic lesions are evident. Adrenals/Urinary Tract: Right adrenal appears normal. There is a stable adenoma in the left adrenal measuring 1.0 x 0.8 cm. Kidneys bilaterally show no evidence of mass or hydronephrosis either side. There is no renal or ureteral calculus on either side. Urinary bladder is midline with wall thickness within normal limits. Stomach/Bowel: There is no bowel wall or mesenteric thickening. There is no apparent bowel obstruction. No free air or portal venous air. Vascular/Lymphatic: There are areas of atherosclerotic calcification in the aorta and proximal common iliac  arteries. There is no abdominal aortic aneurysm. The major mesenteric vessels appear patent. There is no adenopathy in the abdomen or pelvis. Reproductive: Uterus is absent. There is no pelvic mass or pelvic fluid collection. Other: Appendix is not seen. There is no periappendiceal region inflammation. There is no ascites or abscess in the abdomen or pelvis. Musculoskeletal: There is degenerative change in the lumbar spine with vacuum phenomenon at L4-5. There are no blastic or lytic bone lesions. There is no intramuscular or abdominal wall lesion. IMPRESSION: No bowel obstruction or bowel wall thickening. No abscess. No periappendiceal region inflammation. No renal or ureteral calculus.  No hydronephrosis. Small stable left adrenal adenoma, a benign finding. Probable small hemangioma in the liver. Uterus absent. Electronically Signed: By: Lowella Grip III M.D. On: 01/17/2016 12:26   Procedures Procedures (including critical care time)  Medications  Ordered in ED Medications  HYDROmorphone (DILAUDID) injection 0.5 mg (0.5 mg Intravenous Given 01/17/16 1052)  ondansetron (ZOFRAN) injection 4 mg (4 mg Intravenous Given 01/17/16 1049)  sodium chloride 0.9 % bolus 1,000 mL (0 mLs Intravenous Stopped 01/17/16 1332)  iopamidol (ISOVUE-300) 61 % injection (100 mLs  Contrast Given 01/17/16 1115)     Initial Impression / Assessment and Plan / ED Course  I have reviewed the triage vital signs and the nursing notes.  Pertinent labs & imaging results that were available during my care of the patient were reviewed by me and considered in my medical decision making (see chart for details).  Clinical Course  Value Comment By Time  CT ABDOMEN PELVIS W CONTRAST CT is negative for acute abnormality.  Margarita Mail, PA-C 07/25 1248   Patient seen in sharedvisit with Dr. Venora Maples. Patient's pain seems to be worse throughout the day. She also endorses claudication symptoms. She does have vascular disease on her abdominal disease. I am concerned her pain may be secondary to peripheral vascular disease with intermittent mesenteric ischemia. I spoke with care coordination to have given the patient a follow-up appointment with the sickle cell clinic. She will also need GI follow-up. The patient was counseled on the dangers of tobacco use, and was advised to quit.  Reviewed strategies to maximize success, including removing cigarettes and smoking materials from environment, stress management, substitution of other forms of reinforcement, support of family/friends and written materials  The patient appears reasonably screened and/or stabilized for discharge and I doubt any other medical condition or other [facility] requiring further screening, evaluation, or treatment in the ED at this time prior to discharge.   Margarita Mail, PA-C 07/25 1544      Final Clinical Impressions(s) / ED Diagnoses   Final diagnoses:  Left upper quadrant pain  Tobacco abuse  Claudication  [facility])    New Prescriptions Discharge Medication List as of 01/17/2016  3:05 PM    START taking these medications   Details  dicyclomine (BENTYL) 20 MG tablet Take 1 tablet (20 mg total) by mouth 2 (two) times daily., Starting Tue 01/17/2016, Print    diphenoxylate-atropine (LOMOTIL) 2.5-0.025 MG tablet Take 1 tablet by mouth 4 (four) times daily as needed for diarrhea or loose stools., Starting Tue 01/17/2016, Print    ondansetron (ZOFRAN) 4 MG tablet Take 1 tablet (4 mg total) by mouth every 6 (six) hours., Starting Tue 01/17/2016, Print         [place], PA-C 01/17/16 [place], MD 01/17/16 [place], PA-C 02/28/16 [id number]    Jola Schmidt, MD  02/29/16 0056  

## 2016-01-17 NOTE — Progress Notes (Addendum)
ED CM noted Cm consult ED CM spoke with Vernie Shanks, PA about consult. PA interested in getting pt connected with pcp and GI specialist.  Pt has seen pcp Dr Rex Kras but continues without insurance at this time Pt agreed to a referral Cm completed referral Cm left message for Verlee Rossetti and sent email referral  [facility] staff to see if pt can be seen to offer [id number] application, pcp services and possible GI consult in future  Pt to be contact by [facility] clinical liason

## 2016-01-17 NOTE — ED Notes (Addendum)
Pt drinking Dr. Malachi Bonds on arrival to room. Informed nothing further to drink or eat.

## 2016-03-12 ENCOUNTER — Ambulatory Visit (INDEPENDENT_AMBULATORY_CARE_PROVIDER_SITE_OTHER): Payer: Self-pay | Admitting: Family Medicine

## 2016-03-12 ENCOUNTER — Encounter: Payer: Self-pay | Admitting: Family Medicine

## 2016-03-12 VITALS — BP 120/75 | HR 72 | Temp 97.8°F | Resp 16 | Ht 62.0 in | Wt 125.0 lb

## 2016-03-12 DIAGNOSIS — R197 Diarrhea, unspecified: Secondary | ICD-10-CM

## 2016-03-12 DIAGNOSIS — Z23 Encounter for immunization: Secondary | ICD-10-CM

## 2016-03-12 DIAGNOSIS — Z1159 Encounter for screening for other viral diseases: Secondary | ICD-10-CM

## 2016-03-12 DIAGNOSIS — Z1211 Encounter for screening for malignant neoplasm of colon: Secondary | ICD-10-CM

## 2016-03-12 DIAGNOSIS — Z13220 Encounter for screening for lipoid disorders: Secondary | ICD-10-CM

## 2016-03-12 DIAGNOSIS — Z131 Encounter for screening for diabetes mellitus: Secondary | ICD-10-CM

## 2016-03-12 LAB — HEPATITIS C ANTIBODY: HCV Ab: NEGATIVE

## 2016-03-12 LAB — LIPID PANEL
CHOLESTEROL: 228 mg/dL — AB (ref 125–200)
HDL: 66 mg/dL (ref >=46)
LDL Cholesterol: 140 mg/dL — ABNORMAL HIGH (ref <130)
TRIGLYCERIDES: 111 mg/dL (ref <150)
Total CHOL/HDL Ratio: 3.5 Ratio (ref <=5.0)
VLDL: 22 mg/dL (ref <30)

## 2016-03-12 NOTE — Patient Instructions (Signed)
I am refilling medictions for symptoms today Work on getting [employer] so we can refer to GI

## 2016-03-13 ENCOUNTER — Other Ambulatory Visit: Payer: Self-pay | Admitting: Family Medicine

## 2016-03-13 LAB — HEMOGLOBIN A1C
HEMOGLOBIN A1C: 5.3 % (ref <5.7)
MEAN PLASMA GLUCOSE: 105 mg/dL

## 2016-03-13 MED ORDER — SIMVASTATIN 20 MG PO TABS: 20.0000 mg | ORAL_TABLET | Freq: Every day | ORAL | 3 refills | Status: DC

## 2016-03-14 NOTE — Progress Notes (Signed)
Carolyn Vincent, is a 60 y.o. female  [id number]  [id number]  DOB - 12-Oct-1955  CC:  Chief Complaint  Patient presents with  . Establish Care  . Abdominal Pain  . Diarrhea    x 3 months        HPI: Carolyn Vincent is a 60 y.o. female here to establish care. She has a history of hypertension and is on lisinopril/hctz. Her major complaint at this time is a 3 month history of adb cramping and diarrhea. She reports having about 3 soft (not watery) stools daily. She was seen in ED in July and was prescribed Bently and Lomotil.She is unclear about how much this has helped.She has also been prescribed phenergan for nausea. She is requesting a referral to GI.She reports eating a lot of fresh produce. She admits to smoking 1 pk of cigarettes daily and not being ready to quit. She needs a mammogram. She has had a hysterectomy She is to be given Tdap and flu today.  Allergies  Allergen Reactions  . Benzodiazepines Other (See Comments)    Pt noted memory problems and disinhibition and depression on Xanax  . Indomethacin Anaphylaxis  . Sulfa Antibiotics Nausea And Vomiting    Violently ill  . Codeine Nausea And Vomiting   Past Medical History:  Diagnosis Date  . Asthma 03/30/2012   Reports Hx of "asthma attacks" but not asthma.  . Chest pain 03/30/2012   Recurrent; scales at 5 of 10 at this time (12:45)  . Hypertension   . Tendonitis of ankle or foot 03/30/2012   Left side   Current Outpatient Prescriptions on File Prior to Visit  Medication Sig Dispense Refill  . lisinopril-hydrochlorothiazide (PRINZIDE,ZESTORETIC) 10-12.5 MG per tablet Take 1 tablet by mouth at bedtime.    . cyclobenzaprine (FLEXERIL) 5 MG tablet Take 1 tablet (5 mg total) by mouth 3 (three) times daily as needed for muscle spasms. (Patient not taking: Reported on 03/12/2016) 15 tablet 0  . dicyclomine (BENTYL) 20 MG tablet Take 1 tablet (20 mg total) by mouth 2 (two) times daily. (Patient not taking: Reported on  03/12/2016) 20 tablet 0  . diphenoxylate-atropine (LOMOTIL) 2.5-0.025 MG tablet Take 1 tablet by mouth 4 (four) times daily as needed for diarrhea or loose stools. (Patient not taking: Reported on 03/12/2016) 30 tablet 0  . ibuprofen (ADVIL,MOTRIN) 800 MG tablet Take 1 tablet (800 mg total) by mouth every 8 (eight) hours as needed for mild pain. (Patient not taking: Reported on 03/12/2016) 30 tablet 0  . ondansetron (ZOFRAN) 4 MG tablet Take 1 tablet (4 mg total) by mouth every 6 (six) hours. (Patient not taking: Reported on 03/12/2016) 12 tablet 0  . oxyCODONE-acetaminophen (PERCOCET/ROXICET) 5-325 MG per tablet Take 1 tablet by mouth every 4 (four) hours as needed. (Patient not taking: Reported on 03/12/2016) 15 tablet 0  . promethazine (PHENERGAN) 25 MG tablet Take 1 tablet (25 mg total) by mouth every 6 (six) hours as needed for nausea or vomiting. (Patient not taking: Reported on 03/12/2016) 15 tablet 0   No current facility-administered medications on file prior to visit.    History reviewed. No pertinent family history. Social History   Social History  . Marital status: Single    Spouse name: N/A  . Number of children: N/A  . Years of education: N/A   Occupational History  . Not on file.   Social History Main Topics  . Smoking status: Current Every Day Smoker    Packs/day: 1.00  Years: 40.00    Types: Cigarettes  . Smokeless tobacco: Never Used  . Alcohol use No     Comment: occ  . Drug use: No  . Sexual activity: Yes    Birth control/ protection: Surgical     Comment: hysterectomy 1995 and 2005   Other Topics Concern  . Not on file   Social History Narrative  . No narrative on file    Review of Systems: Constitutional: Positive for fatigue Skin: Negative HENT: Negative  Eyes: Negative  Neck: Negative Respiratory: Negative Cardiovascular: Intermittent swelling of feet and legs Gastrointestinal: Positive for abd cramping, nausea, vomiting, diarrhea, constipation and  heartburn Genitourinary: Negative  Musculoskeletal: Negative   Neurological: Negative  Hematological: Negative  Psychiatric/Behavioral: Negative    Objective:   Vitals:   03/12/16 0903  BP: 120/75  Pulse: 72  Resp: 16  Temp: 97.8 F (36.6 C)    Physical Exam: Constitutional: Patient appears well-developed and well-nourished. No distress. HENT: Normocephalic, atraumatic, External right and left ear normal. Oropharynx is clear and moist.  Eyes: Conjunctivae and EOM are normal. PERRLA, no scleral icterus. Neck: Normal ROM. Neck supple. No lymphadenopathy, No thyromegaly. CVS: RRR, S1/S2 +, no murmurs, no gallops, no rubs Pulmonary: Effort and breath sounds normal, no stridor, rhonchi, wheezes, rales.  Abdominal: Soft. Normoactive BS,, no distension, tenderness, rebound or guarding.  Musculoskeletal: Normal range of motion. No edema and no tenderness.  Neuro: Alert.Normal muscle tone coordination. Non-focal Skin: Skin is warm and dry. No rash noted. Not diaphoretic. No erythema. No pallor. Psychiatric: Normal mood and affect. Behavior, judgment, thought content normal.  Lab Results  Component Value Date   WBC 8.2 01/17/2016   HGB 14.3 01/17/2016   HCT 41.9 01/17/2016   MCV 89.3 01/17/2016   PLT 333 01/17/2016   Lab Results  Component Value Date   CREATININE 0.67 01/17/2016   BUN 10 01/17/2016   NA 138 01/17/2016   K 3.7 01/17/2016   CL 102 01/17/2016   CO2 28 01/17/2016    Lab Results  Component Value Date   HGBA1C 5.3 03/12/2016   Lipid Panel     Component Value Date/Time   CHOL 228 (H) 03/12/2016 0945   TRIG 111 03/12/2016 0945   HDL 66 03/12/2016 0945   CHOLHDL 3.5 03/12/2016 0945   VLDL 22 03/12/2016 0945   LDLCALC 140 (H) 03/12/2016 0945       Assessment and plan:   1. Screening for diabetes mellitus  - Hemoglobin A1c  2. Screening cholesterol level  - Lipid panel  3. Need for Tdap vaccination   4. Need for prophylactic vaccination and  inoculation against influenza  - Flu Vaccine QUAD 36+ mos PF IM (Fluarix & Fluzone Quad PF)  5. Need for hepatitis C screening test  - Hepatitis C Antibody  6. Special screening for malignant neoplasms, colon  - POC Hemoccult Bld/Stl (3-Cd Home Screen); Future  7. abd pain and diarrhea -referral to GI   Return in about 3 months (around 06/11/2016).  The patient was given clear instructions to go to ER or return to medical center if symptoms don't improve, worsen or new problems develop. The patient verbalized understanding.    Micheline Chapman FNP  03/14/2016, 10:07 AM

## 2016-05-14 ENCOUNTER — Encounter: Payer: Self-pay | Admitting: Family Medicine

## 2016-06-11 ENCOUNTER — Ambulatory Visit: Payer: Self-pay | Admitting: Family Medicine

## 2016-07-17 ENCOUNTER — Ambulatory Visit: Payer: Self-pay | Admitting: Adult Health

## 2018-01-17 ENCOUNTER — Emergency Department (HOSPITAL_COMMUNITY): Payer: Self-pay

## 2018-01-17 ENCOUNTER — Other Ambulatory Visit: Payer: Self-pay

## 2018-01-17 ENCOUNTER — Encounter (HOSPITAL_COMMUNITY): Payer: Self-pay | Admitting: Emergency Medicine

## 2018-01-17 ENCOUNTER — Emergency Department (HOSPITAL_COMMUNITY)
Admission: EM | Admit: 2018-01-17 | Discharge: 2018-01-17 | Disposition: A | Discharge status: Home / Self Care | Payer: Self-pay | Attending: Emergency Medicine | Admitting: Emergency Medicine

## 2018-01-17 DIAGNOSIS — I10 Essential (primary) hypertension: Secondary | ICD-10-CM | POA: Insufficient documentation

## 2018-01-17 DIAGNOSIS — F17210 Nicotine dependence, cigarettes, uncomplicated: Secondary | ICD-10-CM | POA: Insufficient documentation

## 2018-01-17 DIAGNOSIS — K5900 Constipation, unspecified: Secondary | ICD-10-CM | POA: Insufficient documentation

## 2018-01-17 DIAGNOSIS — Z79899 Other long term (current) drug therapy: Secondary | ICD-10-CM | POA: Insufficient documentation

## 2018-01-17 DIAGNOSIS — J45909 Unspecified asthma, uncomplicated: Secondary | ICD-10-CM | POA: Insufficient documentation

## 2018-01-17 LAB — COMPREHENSIVE METABOLIC PANEL
ALK PHOS: 64 U/L (ref 38–126)
ALT: 14 U/L (ref 0–44)
ANION GAP: 9 (ref 5–15)
AST: 14 U/L — ABNORMAL LOW (ref 15–41)
Albumin: 4.1 g/dL (ref 3.5–5.0)
BILIRUBIN TOTAL: 0.6 mg/dL (ref 0.3–1.2)
BUN: 11 mg/dL (ref 8–23)
CO2: 24 mmol/L (ref 22–32)
CREATININE: 0.74 mg/dL (ref 0.44–1.00)
Calcium: 9.3 mg/dL (ref 8.9–10.3)
Chloride: 108 mmol/L (ref 98–111)
Glucose, Bld: 96 mg/dL (ref 70–99)
Potassium: 3.8 mmol/L (ref 3.5–5.1)
SODIUM: 141 mmol/L (ref 135–145)
Total Protein: 6.6 g/dL (ref 6.5–8.1)

## 2018-01-17 LAB — CBC
HCT: 40.7 % (ref 36.0–46.0)
Hemoglobin: 13.6 g/dL (ref 12.0–15.0)
MCH: 31.4 pg (ref 26.0–34.0)
MCHC: 33.4 g/dL (ref 30.0–36.0)
MCV: 94 fL (ref 78.0–100.0)
PLATELETS: 306 10*3/uL (ref 150–400)
RBC: 4.33 MIL/uL (ref 3.87–5.11)
RDW: 13.5 % (ref 11.5–15.5)
WBC: 7.7 10*3/uL (ref 4.0–10.5)

## 2018-01-17 LAB — URINALYSIS, ROUTINE W REFLEX MICROSCOPIC
Bacteria, UA: NONE SEEN
Bilirubin Urine: NEGATIVE
Glucose, UA: NEGATIVE mg/dL
Ketones, ur: NEGATIVE mg/dL
Leukocytes, UA: NEGATIVE
Nitrite: NEGATIVE
Protein, ur: NEGATIVE mg/dL
SPECIFIC GRAVITY, URINE: 1.01 (ref 1.005–1.030)
pH: 6 (ref 5.0–8.0)

## 2018-01-17 LAB — LIPASE, BLOOD: Lipase: 31 U/L (ref 11–51)

## 2018-01-17 MED ORDER — POLYETHYLENE GLYCOL 3350 17 G PO PACK: 17.0000 g | PACK | Freq: Every day | ORAL | 0 refills | Status: DC

## 2018-01-17 MED ORDER — ONDANSETRON 4 MG PO TBDP: 4.0000 mg | ORAL_TABLET | Freq: Three times a day (TID) | ORAL | 0 refills | Status: DC | PRN

## 2018-01-17 MED ORDER — SODIUM CHLORIDE 0.9 % IV BOLUS
1000.0000 mL | Freq: Once | INTRAVENOUS | Status: AC
  Administered 2018-01-17: 1000 mL via INTRAVENOUS

## 2018-01-17 MED ORDER — IOHEXOL 300 MG/ML  SOLN
100.0000 mL | Freq: Once | INTRAMUSCULAR | Status: AC | PRN
  Administered 2018-01-17: 100 mL via INTRAVENOUS

## 2018-01-17 MED ORDER — LORAZEPAM 2 MG/ML IJ SOLN
0.5000 mg | Freq: Once | INTRAMUSCULAR | Status: AC
  Administered 2018-01-17: 0.5 mg via INTRAVENOUS
  Filled 2018-01-17: qty 1

## 2018-01-17 NOTE — ED Notes (Signed)
Patient verbalizes understanding of discharge instructions. Opportunity for questioning and answers were provided. Armband removed by staff, pt discharged from ED.  

## 2018-01-17 NOTE — ED Provider Notes (Signed)
[place] EMERGENCY DEPARTMENT Provider Note   CSN: [id number] Arrival date & time: 01/17/18  1046     History   Chief Complaint Chief Complaint  Patient presents with  . Abdominal Pain    HPI Carolyn Vincent is a 62 y.o. female with a past medical history of hypertension, who presents to ED for evaluation of 2-week history of generalized abdominal pain/pressure, emesis.  States that anytime she goes to the bathroom to have a bowel movement (which she describes as "somewhat loose but not diarrhea"), she will experience worsening of her abdominal pain that radiates to her lower back and will begin to have nonbloody, nonbilious emesis.  She was seen and evaluated by her PCP 4 days ago and was given Macrobid for UTI.  Her PCP has recommended that she takes this daily due to her recurrent UTIs.  States that the Macrobid has helped her urinary symptoms but she continues to have the emesis and abdominal pain.  She denies any hematemesis, hematochezia or melena.  No sick contacts with similar symptoms.  She denies any fever, recent travel, drug use, vaginal complaints, shortness of breath, chest pain.  Reports occasional alcohol use and daily tobacco use.  Prior abdominal surgeries include hysterectomy several years ago.  She reports taking NSAIDs nightly to help her sleep.  Last colonoscopy was 10 years ago.  HPI  Past Medical History:  Diagnosis Date  . Asthma 03/30/2012   Reports Hx of "asthma attacks" but not asthma.  . Chest pain 03/30/2012   Recurrent; scales at 5 of 10 at this time (12:45)  . Hypertension   . Tendonitis of ankle or foot 03/30/2012   Left side    Patient Active Problem List   Diagnosis Date Noted  . Major depression 04/03/2012    Class: Chronic  . 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 ABDOMINAL HYSTERECTOMY       OB History   None      Home Medications    Prior to Admission medications   Medication Sig Start Date End Date Taking? Authorizing Provider  dicyclomine (BENTYL) 20 MG tablet Take 1 tablet (20 mg total) by mouth 2 (two) times daily. Patient not taking: Reported on 03/12/2016 01/17/16   Margarita Mail, PA-C  diphenoxylate-atropine (LOMOTIL) 2.5-0.025 MG tablet Take 1 tablet by mouth 4 (four) times daily as needed for diarrhea or loose stools. Patient not taking: Reported on 03/12/2016 01/17/16   Margarita Mail, PA-C  ibuprofen (ADVIL,MOTRIN) 800 MG tablet Take 1 tablet (800 mg total) by mouth every 8 (eight) hours as needed for mild pain. Patient not taking: Reported on 03/12/2016 05/13/14   Ward, Delice Bison, DO  lisinopril-hydrochlorothiazide (PRINZIDE,ZESTORETIC) 10-12.5 MG per tablet Take 1 tablet by mouth at bedtime.    [provider]  ondansetron (ZOFRAN ODT) 4 MG disintegrating tablet Take 1 tablet (4 mg total) by mouth every 8 (eight) hours as needed for nausea or vomiting. 01/17/18   Houa Ackert, PA-C  polyethylene glycol (MIRALAX / GLYCOLAX) packet Take 17 g by mouth daily. 01/17/18   Osamu Olguin, PA-C  promethazine (PHENERGAN) 25 MG tablet Take 1 tablet (25 mg total) by mouth every 6 (six) hours as needed for nausea or vomiting. Patient not taking: Reported on 03/12/2016 05/13/14   Ward, Delice Bison, DO  simvastatin (ZOCOR) 20 MG tablet Take 1 tablet (20 mg total) by mouth at bedtime. 03/13/16   Micheline Chapman, NP  zolpidem (AMBIEN) 10 MG tablet Take 5 mg by mouth at bedtime.    [provider]    Family History No family history on file.  Social History Social History   Tobacco Use  . Smoking status: Current Every Day Smoker    Packs/day: 1.00    Years: 40.00    Pack years: 40.00    Types: Cigarettes  . Smokeless tobacco: Never Used  Substance Use Topics  . Alcohol use: No    Comment: occ  . Drug use: No      Allergies   Benzodiazepines; Indomethacin; Sulfa antibiotics; and Codeine   Review of Systems Review of Systems  Constitutional: Negative for appetite change, chills and fever.  HENT: Negative for ear pain, rhinorrhea, sneezing and sore throat.   Eyes: Negative for photophobia and visual disturbance.  Respiratory: Negative for cough, chest tightness, shortness of breath and wheezing.   Cardiovascular: Negative for chest pain and palpitations.  Gastrointestinal: Positive for abdominal pain, nausea and vomiting. Negative for blood in stool, constipation and diarrhea.  Genitourinary: Negative for dysuria, hematuria and urgency.  Musculoskeletal: Negative for myalgias.  Skin: Negative for rash.  Neurological: Negative for dizziness, weakness and light-headedness.     Physical Exam Updated Vital Signs BP (!) 162/103   Pulse 74   Temp 97.7 F (36.5 C) (Oral)   Resp 18   Ht 5\' 2"  (1.575 m)   Wt 57.2 kg (126 lb)   LMP 06/25/1993   SpO2 100%   BMI 23.05 kg/m   Physical Exam  Constitutional: She appears well-developed and well-nourished. No distress.  HENT:  Head: Normocephalic and atraumatic.  Nose: Nose normal.  Eyes: Conjunctivae and EOM are normal. Left eye exhibits no discharge. No scleral icterus.  Neck: Normal range of motion. Neck supple.  Cardiovascular: Normal rate, regular rhythm, normal heart sounds and intact distal pulses. Exam reveals no gallop and no friction rub.  No murmur heard. Pulmonary/Chest: Effort normal and breath sounds normal. No respiratory distress.  Abdominal: Soft. Bowel sounds are normal. She exhibits no distension. There is tenderness in the right lower quadrant, periumbilical area, suprapubic area and left lower quadrant. There is no guarding.  Musculoskeletal: Normal range of motion. She exhibits no edema.  Neurological: She is alert. She exhibits normal muscle tone. Coordination normal.  Skin: Skin is warm and dry. No rash noted.   Psychiatric: She has a normal mood and affect.  Nursing note and vitals reviewed.    ED Treatments / Results  Labs (all labs ordered are listed, but only abnormal results are displayed) Labs Reviewed  COMPREHENSIVE METABOLIC PANEL - Abnormal; Notable for the following components:      Result Value   AST 14 (*)    All other components within normal limits  URINALYSIS, ROUTINE W REFLEX MICROSCOPIC - Abnormal; Notable for the following components:   Hgb urine dipstick SMALL (*)    All other components within normal limits  LIPASE, BLOOD  CBC    EKG None  Radiology Ct Abdomen Pelvis W Contrast  Result Date: 01/17/2018 CLINICAL DATA:  Abdominal pain, diarrhea and emesis for the past several weeks. EXAM: CT ABDOMEN AND PELVIS WITH CONTRAST TECHNIQUE: Multidetector CT imaging of the abdomen and pelvis was performed using the standard protocol following bolus administration of intravenous contrast. CONTRAST:  183mL OMNIPAQUE IOHEXOL 300 MG/ML  SOLN COMPARISON:  CT abdomen pelvis-01/17/2016; 05/28/2013 FINDINGS: Lower chest: Limited visualization of the lower thorax demonstrates minimal ground-glass atelectasis within the bilateral lung bases, right greater than left. No discrete focal airspace opacities. No pleural effusion. Normal heart size.  No pericardial effusion. Hepatobiliary: Normal hepatic contour. No discrete hepatic lesions. Normal appearance of the gallbladder given degree distention. No radiopaque gallstones. No intra or extrahepatic biliary ductal dilatation. No ascites. Pancreas: Normal appearance of the pancreas Spleen: Normal appearance of the spleen Adrenals/Urinary Tract: There is symmetric enhancement and excretion of the bilateral kidneys. Punctate calcification about the right renal hilum is favored to be vascular in etiology. No definite renal stones on this post contrast examination. Punctate subcentimeter hypoattenuating lesion with the interpolar aspect of the left  kidney is too small to accurately characterize though favored to represent a renal cyst. No urine obstruction or perinephric stranding. Normal appearance of the bilateral adrenal glands. Normal appearance of the urinary bladder given degree distention. Stomach/Bowel: Large colonic stool burden without evidence of enteric obstruction. No discrete areas of bowel wall thickening. Normal appearance of the terminal ileum and retrocecal appendix. No pneumoperitoneum, pneumatosis or portal venous gas. Vascular/Lymphatic: Mixed calcified and noncalcified atherosclerotic plaque within a mildly tortuous but normal caliber abdominal aorta, not resulting in a hemodynamically significant stenosis. The major branch vessels of the abdominal aorta appear patent on this non CTA examination. No bulky retroperitoneal, mesenteric, pelvic or inguinal lymphadenopathy. Reproductive: Post hysterectomy. No discrete adnexal lesion. No free fluid the pelvic cul-de-sac. Other: Regional soft tissues appear normal. Musculoskeletal: No acute or aggressive osseous abnormalities. Grade 1 anterolisthesis of L4 upon L5 with moderate DDD of L4-L5 with disc space height loss, endplate irregularity and sclerosis. No associated pars defects. IMPRESSION: 1. Large colonic stool burden without evidence of enteric obstruction. 2.  Aortic Atherosclerosis (ICD10-I70.0). Electronically Signed   By: Sandi Mariscal M.D.   On: 01/17/2018 16:08    Procedures Procedures (including critical care time)  Medications Ordered in ED Medications  sodium chloride 0.9 % bolus 1,000 mL (0 mLs Intravenous Stopped 01/17/18 1443)  LORazepam (ATIVAN) injection 0.5 mg (0.5 mg Intravenous Given 01/17/18 1455)  iohexol (OMNIPAQUE) 300 MG/ML solution 100 mL (100 mLs Intravenous Contrast Given 01/17/18 1557)     Initial Impression / Assessment and Plan / ED Course  I have reviewed the triage vital signs and the nursing notes.  Pertinent labs & imaging results that were  available during my care of the patient were reviewed by me and considered in my medical decision making (see chart for details).     62 year old female presents to ED for evaluation of 2-week history of generalized abdominal pain/pressure and emesis.  States that anytime she tries to go to the bathroom to have a bowel movement, she will have worsening of her abdominal pain and episodes of emesis as a result of it.  She was seen by her PCP 4 days ago and was given Macrobid for UTI.  PCP recommends that she takes Macrobid daily due to her recurrent UTIs.  States that this has helped her urinary symptoms but her abdominal pain and vomiting persists.  She was referred by her PCP for further lab work and imaging to rule out diverticulitis.  Physical exam findings include tenderness to palpation of the lower abdomen and suprapubic area.  No rebound or guarding noted.  She is afebrile.  Lab work including CBC, CMP, urinalysis and lipase all unremarkable. No leukocytosis or signs of dehydration noted. CT of the abdomen and pelvis  shows large stool burden without obstruction or any other acute abnormality.  Suspect that her discomfort could be secondary to the large stool burden.  There is not appear to be any infectious or inflammatory process.  I informed patient of her negative work-up and reassuring vitals.  Because she continues to have daily bowel movements, she may need MiraLAX as needed to help move her bowels.  We will also give Zofran to take as needed for nausea.  Advised to follow-up with her PCP and to return to ED for any severe worsening symptoms.  Portions of this note were generated with [employer]. Dictation errors may occur despite best attempts at proofreading.   Final Clinical Impressions(s) / ED Diagnoses   Final diagnoses:  Constipation, unspecified constipation type    ED Discharge Orders        Ordered    polyethylene glycol (MIRALAX / GLYCOLAX) packet  Daily      01/17/18 1618    ondansetron (ZOFRAN ODT) 4 MG disintegrating tablet  Every 8 hours PRN     01/17/18 1618       Delia Heady, PA-C 01/17/18 1620    Duffy Bruce, MD 01/20/18 [phone]

## 2018-01-17 NOTE — Discharge Instructions (Signed)
Return to ED if you start to develop worsening pain, fevers, blood in your stool, vomiting up blood, chest pain or shortness of breath.

## 2018-01-17 NOTE — ED Notes (Signed)
Patient transported to CT 

## 2018-01-17 NOTE — ED Triage Notes (Signed)
Patient complains of abdominal pain, diarrhea, emesis for the last several weeks. Went to PCP Monday, was told she needed to come to ED for evaluation for concern of diverticulitis. Patient alert, oriented, and ambulating independently with steady gait.

## 2018-07-21 ENCOUNTER — Emergency Department (HOSPITAL_COMMUNITY): Payer: Self-pay

## 2018-07-21 ENCOUNTER — Encounter (HOSPITAL_COMMUNITY): Payer: Self-pay

## 2018-07-21 ENCOUNTER — Emergency Department (HOSPITAL_COMMUNITY)
Admission: EM | Admit: 2018-07-21 | Discharge: 2018-07-21 | Disposition: A | Discharge status: Home / Self Care | Payer: Self-pay | Attending: Emergency Medicine | Admitting: Emergency Medicine

## 2018-07-21 DIAGNOSIS — I10 Essential (primary) hypertension: Secondary | ICD-10-CM | POA: Insufficient documentation

## 2018-07-21 DIAGNOSIS — Z79899 Other long term (current) drug therapy: Secondary | ICD-10-CM | POA: Insufficient documentation

## 2018-07-21 DIAGNOSIS — R1013 Epigastric pain: Secondary | ICD-10-CM | POA: Insufficient documentation

## 2018-07-21 DIAGNOSIS — F17210 Nicotine dependence, cigarettes, uncomplicated: Secondary | ICD-10-CM | POA: Insufficient documentation

## 2018-07-21 DIAGNOSIS — J45909 Unspecified asthma, uncomplicated: Secondary | ICD-10-CM | POA: Insufficient documentation

## 2018-07-21 DIAGNOSIS — R1084 Generalized abdominal pain: Secondary | ICD-10-CM

## 2018-07-21 LAB — URINALYSIS, ROUTINE W REFLEX MICROSCOPIC
Bacteria, UA: NONE SEEN
Bilirubin Urine: NEGATIVE
Glucose, UA: NEGATIVE mg/dL
Ketones, ur: NEGATIVE mg/dL
Leukocytes, UA: NEGATIVE
Nitrite: NEGATIVE
Protein, ur: NEGATIVE mg/dL
Specific Gravity, Urine: 1.009 (ref 1.005–1.030)
pH: 6 (ref 5.0–8.0)

## 2018-07-21 LAB — COMPREHENSIVE METABOLIC PANEL
ALT: 19 U/L (ref 0–44)
AST: 18 U/L (ref 15–41)
Albumin: 4.6 g/dL (ref 3.5–5.0)
Alkaline Phosphatase: 88 U/L (ref 38–126)
Anion gap: 11 (ref 5–15)
BILIRUBIN TOTAL: 0.6 mg/dL (ref 0.3–1.2)
BUN: 9 mg/dL (ref 8–23)
CO2: 21 mmol/L — ABNORMAL LOW (ref 22–32)
Calcium: 9.8 mg/dL (ref 8.9–10.3)
Chloride: 106 mmol/L (ref 98–111)
Creatinine, Ser: 0.75 mg/dL (ref 0.44–1.00)
GFR calc non Af Amer: >60 mL/min (ref >60)
Glucose, Bld: 108 mg/dL — ABNORMAL HIGH (ref 70–99)
Potassium: 3.6 mmol/L (ref 3.5–5.1)
Sodium: 138 mmol/L (ref 135–145)
TOTAL PROTEIN: 7.5 g/dL (ref 6.5–8.1)

## 2018-07-21 LAB — CBC
HCT: 46.6 % — ABNORMAL HIGH (ref 36.0–46.0)
Hemoglobin: 15.8 g/dL — ABNORMAL HIGH (ref 12.0–15.0)
MCH: 30.4 pg (ref 26.0–34.0)
MCHC: 33.9 g/dL (ref 30.0–36.0)
MCV: 89.8 fL (ref 80.0–100.0)
NRBC: 0 % (ref 0.0–0.2)
Platelets: 369 10*3/uL (ref 150–400)
RBC: 5.19 MIL/uL — ABNORMAL HIGH (ref 3.87–5.11)
RDW: 13.3 % (ref 11.5–15.5)
WBC: 9.7 10*3/uL (ref 4.0–10.5)

## 2018-07-21 LAB — POC OCCULT BLOOD, ED: Fecal Occult Bld: NEGATIVE

## 2018-07-21 LAB — LIPASE, BLOOD: Lipase: 33 U/L (ref 11–51)

## 2018-07-21 MED ORDER — HYOSCYAMINE SULFATE 0.125 MG SL SUBL
0.1250 mg | SUBLINGUAL_TABLET | Freq: Once | SUBLINGUAL | Status: AC
  Administered 2018-07-21: 0.125 mg via SUBLINGUAL
  Filled 2018-07-21 (×3): qty 1

## 2018-07-21 MED ORDER — HYOSCYAMINE SULFATE 0.125 MG PO TABS
0.1250 mg | ORAL_TABLET | Freq: Once | ORAL | Status: DC
  Filled 2018-07-21 (×3): qty 1

## 2018-07-21 MED ORDER — FENTANYL CITRATE (PF) 100 MCG/2ML IJ SOLN
50.0000 ug | Freq: Once | INTRAMUSCULAR | Status: AC
  Administered 2018-07-21: 50 ug via INTRAVENOUS
  Filled 2018-07-21: qty 2

## 2018-07-21 MED ORDER — METOCLOPRAMIDE HCL 10 MG PO TABS: 10.0000 mg | ORAL_TABLET | Freq: Four times a day (QID) | ORAL | 0 refills | Status: DC | PRN

## 2018-07-21 MED ORDER — SODIUM CHLORIDE 0.9% FLUSH: 3.0000 mL | Freq: Once | INTRAVENOUS | Status: DC

## 2018-07-21 MED ORDER — HYOSCYAMINE SULFATE SL 0.125 MG SL SUBL: 0.1250 mg | SUBLINGUAL_TABLET | SUBLINGUAL | 0 refills | Status: DC | PRN

## 2018-07-21 MED ORDER — ONDANSETRON HCL 4 MG/2ML IJ SOLN
4.0000 mg | Freq: Once | INTRAMUSCULAR | Status: AC
  Administered 2018-07-21: 4 mg via INTRAVENOUS
  Filled 2018-07-21: qty 2

## 2018-07-21 NOTE — ED Notes (Signed)
CSW provided patient with paperwork regarding orange card application.

## 2018-07-21 NOTE — ED Notes (Signed)
Patient wanting to get dressed and leave. RN updated her on plan of care. Hyoscyamine has still not arrived - pharmacy notified, to send another pill up as soon as possible. Patient also waiting on case management - will leave voicemail.

## 2018-07-21 NOTE — ED Provider Notes (Signed)
[facility] EMERGENCY DEPARTMENT Provider Note   CSN: [id number] Arrival date & time: 07/21/18  1020     History   Chief Complaint Chief Complaint  Patient presents with  . Abdominal Pain    HPI Carolyn Vincent is a 63 y.o. female.  The history is provided by the patient and medical records.  Abdominal Pain  Pain location:  Epigastric and RLQ Pain quality: bloating and sharp   Pain radiates to:  R flank Pain severity:  Moderate Onset quality:  Gradual Duration:  4 days Timing:  Constant Progression:  Worsening Chronicity:  Recurrent Context: not laxative use, not medication withdrawal, not recent illness, not recent travel and not suspicious food intake   Associated symptoms: nausea   Associated symptoms: no anorexia, no belching, no chest pain, no chills, no constipation, no diarrhea, no dysuria, no fever, no hematemesis, no hematochezia, no hematuria, no melena, no shortness of breath, no sore throat, no vaginal bleeding, no vaginal discharge and no vomiting   Risk factors: no alcohol abuse and no NSAID use    63 y/o F with hx of tobacco abuse, Fhx of colon cancer, personal hx of colon polyps, s/p total hystercetomy who presents to the ER with cc of abd pain and vomiting. The patient states that she has had recurrent bouts of abdominal pain with n/v and sometimes diarrhea for the past several years. She began having pain 4 days ago in her epigastrium and RLQ. She she c/o severe stabbing pain, in her abdomen with vomiting. She denies diarrhea. 3 days ago she states that she passed a single red , quarter sized clot from her bottom. She denies any other bleeding, melena, or hematochezia. She denies urinary sxs.   Past Medical History:  Diagnosis Date  . Asthma 03/30/2012   Reports Hx of "asthma attacks" but not asthma.  . Chest pain 03/30/2012   Recurrent; scales at 5 of 10 at this time (12:45)  . Hypertension   . Tendonitis of ankle or foot 03/30/2012   Left  side    Patient Active Problem List   Diagnosis Date Noted  . Major depression 04/03/2012    Class: Chronic  . 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 ABDOMINAL HYSTERECTOMY       OB History   No obstetric history on file.      Home Medications    Prior to Admission medications   Medication Sig Start Date End Date Taking? Authorizing Provider  dicyclomine (BENTYL) 20 MG tablet Take 1 tablet (20 mg total) by mouth 2 (two) times daily. Patient not taking: Reported on 03/12/2016 01/17/16   Margarita Mail, PA-C  diphenoxylate-atropine (LOMOTIL) 2.5-0.025 MG tablet Take 1 tablet by mouth 4 (four) times daily as needed for diarrhea or loose stools. Patient not taking: Reported on 03/12/2016 01/17/16   Margarita Mail, PA-C  ibuprofen (ADVIL,MOTRIN) 800 MG tablet Take 1 tablet (800 mg total) by mouth every 8 (eight) hours as needed for mild pain. Patient not taking: Reported on 03/12/2016 05/13/14   Ward, Delice Bison, DO  lisinopril-hydrochlorothiazide (PRINZIDE,ZESTORETIC) 10-12.5 MG per tablet Take 1 tablet by mouth at bedtime.    [provider]  ondansetron (ZOFRAN ODT) 4 MG disintegrating tablet Take 1 tablet (4 mg total) by mouth every 8 (eight) hours as needed for nausea  or vomiting. 01/17/18   Khatri, Hina, PA-C  polyethylene glycol (MIRALAX / GLYCOLAX) packet Take 17 g by mouth daily. 01/17/18   Khatri, Hina, PA-C  promethazine (PHENERGAN) 25 MG tablet Take 1 tablet (25 mg total) by mouth every 6 (six) hours as needed for nausea or vomiting. Patient not taking: Reported on 03/12/2016 05/13/14   Ward, Delice Bison, DO  simvastatin (ZOCOR) 20 MG tablet Take 1 tablet (20 mg total) by mouth at bedtime. 03/13/16   Micheline Chapman, NP  zolpidem (AMBIEN) 10 MG tablet Take 5 mg by mouth at bedtime.    [provider]    Family History History reviewed. No pertinent family history.  Social History Social History   Tobacco Use  . Smoking status: Current Every Day Smoker    Packs/day: 1.00    Years: 40.00    Pack years: 40.00    Types: Cigarettes  . Smokeless tobacco: Never Used  Substance Use Topics  . Alcohol use: No    Comment: occ  . Drug use: No     Allergies   Benzodiazepines; Indomethacin; Sulfa antibiotics; and Codeine   Review of Systems Review of Systems  Constitutional: Negative for chills and fever.  HENT: Negative for sore throat.   Respiratory: Negative for shortness of breath.   Cardiovascular: Negative for chest pain.  Gastrointestinal: Positive for abdominal pain and nausea. Negative for anorexia, constipation, diarrhea, hematemesis, hematochezia, melena and vomiting.  Genitourinary: Negative for dysuria, hematuria, vaginal bleeding and vaginal discharge.     Physical Exam Updated Vital Signs BP (!) 184/115 (BP Location: Right Arm)   Pulse 93   Temp (!) 97.5 F (36.4 C) (Oral)   Resp 12   LMP 06/25/1993   SpO2 96%   Physical Exam Vitals signs and nursing note reviewed.  Constitutional:      General: She is not in acute distress.    Appearance: She is well-developed. She is not diaphoretic.  HENT:     Head: Normocephalic and atraumatic.  Eyes:     General: No scleral icterus.    Conjunctiva/sclera: Conjunctivae normal.  Neck:     Musculoskeletal: Normal range of motion.  Cardiovascular:     Rate and Rhythm: Normal rate and regular rhythm.     Heart sounds: Normal heart sounds. No murmur. No friction rub. No gallop.   Pulmonary:     Effort: Pulmonary effort is normal. No respiratory distress.     Breath sounds: Normal breath sounds.  Abdominal:     General: Bowel sounds are normal. There is no distension.     Palpations: Abdomen is soft. There is no mass.     Tenderness: There is generalized abdominal tenderness and tenderness in the  right lower quadrant and epigastric area. There is no right CVA tenderness, left CVA tenderness, guarding or rebound. Negative signs include Murphy's sign, Rovsing's sign, McBurney's sign, psoas sign and obturator sign.  Genitourinary:    Comments: Digital Rectal Exam reveals sphincter with good tone. No external hemorrhoids. No masses or fissures. Stool color is brown with no overt blood. Skin:    General: Skin is warm and dry.  Neurological:     General: No focal deficit present.     Mental Status: She is alert and oriented to person, place, and time.  Psychiatric:        Behavior: Behavior normal.      ED Treatments / Results  Labs (all labs ordered are listed, but only abnormal results are displayed)  Labs Reviewed  COMPREHENSIVE METABOLIC PANEL - Abnormal; Notable for the following components:      Result Value   CO2 21 (*)    Glucose, Bld 108 (*)    All other components within normal limits  CBC - Abnormal; Notable for the following components:   RBC 5.19 (*)    Hemoglobin 15.8 (*)    HCT 46.6 (*)    All other components within normal limits  LIPASE, BLOOD  URINALYSIS, ROUTINE W REFLEX MICROSCOPIC    EKG None  Radiology No results found.  Procedures Procedures (including critical care time)  Medications Ordered in ED Medications  sodium chloride flush (NS) 0.9 % injection 3 mL (has no administration in time range)     Initial Impression / Assessment and Plan / ED Course  I have reviewed the triage vital signs and the nursing notes.  Pertinent labs & imaging results that were available during my care of the patient were reviewed by me and considered in my medical decision making (see chart for details).    63 year old female who is here with complaint of abdominal pain.  She states that she passed a blood clot 2 days ago.  The patient has an elevated hemoglobin which I suspect is polycythemia from her chronic tobacco abuse.  Urine is negative, except for some  microscopic hematuria, she is negative CMP.  Normal lipase.  No bowel obstruction or heavy stool burden noted on her plain film of the abdomen.  She does not have an elevated white blood cell count.  She has generalized abdominal pain.  A normal rectal examination.  She is very upset and angry that she is unable to access definitive care with colonoscopy.  She is also very upset and angry that no one can give her an answer to why she has repeat pain.  I discussed the risk versus benefit of CT scan of her abdomen including the cost prohibitive nature of this exam along with the fact that it is a significant amount of radiation and she had a recent CT scan which was negative, and all CT scans in the past have been negative for her complaint.  She agrees that that is likely not the best course of action at this time.  Patient was given Levsin and fentanyl with significant relief of her symptoms.  She will be discharged with Levsin and Reglan.  I have involved our case manager to help the patient with orange card. Patient wishes to be discharged at this time.   Final Clinical Impressions(s) / ED Diagnoses   Final diagnoses:  None    ED Discharge Orders    None       Margarita Mail, PA-C 07/21/18 1617    Gareth Morgan, MD 07/22/18 2117

## 2018-07-21 NOTE — ED Notes (Signed)
Patient verbalized understanding of discharge instructions and prescription medications and denies any further needs or questions at this time. She states she will apply for orange card as well. VS stable. Patient ambulatory with steady gait - declined wheelchair, requested to walk. RN escorted to ED lobby.

## 2018-07-21 NOTE — ED Triage Notes (Signed)
Pt endorses severe abd pain x 4 days with blood in stool. Pt has hx of same and has not been able to be evaluated due to insurance reasons. Tachy and appears shob in triage. Also hypertensive.

## 2018-07-21 NOTE — Discharge Instructions (Addendum)

## 2018-07-21 NOTE — Discharge Planning (Signed)
EDCM provided pt with orange card application and instructions prior to discharge home.

## 2018-07-21 NOTE — ED Notes (Signed)
Patient transported to x-ray. ?

## 2018-11-24 ENCOUNTER — Ambulatory Visit (INDEPENDENT_AMBULATORY_CARE_PROVIDER_SITE_OTHER): Payer: Self-pay | Admitting: Podiatry

## 2018-11-24 ENCOUNTER — Encounter: Payer: Self-pay | Admitting: Podiatry

## 2018-11-24 ENCOUNTER — Other Ambulatory Visit: Payer: Self-pay

## 2018-11-24 VITALS — BP 130/86 | HR 97 | Temp 98.8°F | Resp 16 | Ht 62.0 in | Wt 128.0 lb

## 2018-11-24 DIAGNOSIS — M21621 Bunionette of right foot: Secondary | ICD-10-CM

## 2018-11-24 DIAGNOSIS — M79676 Pain in unspecified toe(s): Secondary | ICD-10-CM

## 2018-11-24 DIAGNOSIS — L600 Ingrowing nail: Secondary | ICD-10-CM

## 2018-11-24 NOTE — Progress Notes (Signed)
Subjective:  Patient ID: Carolyn Vincent, female    DOB: 02/05/1956,  MRN: [id number]  Chief Complaint  Patient presents with  . Nail Problem    Rt hallux loose and discolored x 1 yr, no pain -pt states that after she trimmed her toenail it made a knot at medial border VV:[id number] and neosporin -pt also c/o rt 5th toe joint pain going on for years    63 y.o. female presents with the above complaint. Hx above confirmed with patient. States the pain in the right 5th toe is hurting despite wearing wider shoes.  Review of Systems: Negative except as noted in the HPI. Denies N/V/F/Ch.  Past Medical History:  Diagnosis Date  . Asthma 03/30/2012   Reports Hx of "asthma attacks" but not asthma.  . Chest pain 03/30/2012   Recurrent; scales at 5 of 10 at this time (12:45)  . Hypertension   . Tendonitis of ankle or foot 03/30/2012   Left side    Current Outpatient Medications:  .  lisinopril (ZESTRIL) 5 MG tablet, Take 5 mg by mouth daily., Disp: , Rfl:   Social History   Tobacco Use  Smoking Status Current Every Day Smoker  . Packs/day: 1.00  . Years: 40.00  . Pack years: 40.00  . Types: Cigarettes  Smokeless Tobacco Never Used    Allergies  Allergen Reactions  . Benzodiazepines Other (See Comments)    Pt noted memory problems and disinhibition and depression on Xanax  . Indomethacin Anaphylaxis  . Sulfa Antibiotics Nausea And Vomiting    Violently ill  . Codeine Nausea And Vomiting   Objective:   Vitals:   11/24/18 0824  BP: 130/86  Pulse: 97  Resp: 16  Temp: 98.8 F (37.1 C)   Body mass index is 23.41 kg/m. Constitutional Well developed. Well nourished.  Vascular Dorsalis pedis pulses palpable bilaterally. Posterior tibial pulses palpable bilaterally. Capillary refill normal to all digits.  No cyanosis or clubbing noted. Pedal hair growth normal.  Neurologic Normal speech. Oriented to person, place, and time. Epicritic sensation to light touch grossly present  bilaterally.  Dermatologic Painful ingrowing nail at lateral nail borders of the hallux nail right. No other open wounds. No skin lesions.  Orthopedic: Normal joint ROM without pain or crepitus bilaterally. Tailor's bunion right with POP R 5th MPJ   Radiographs: None Assessment:   1. Tailor's bunionette, right   2. Ingrown nail   3. Pain around toenail    Plan:  Patient was evaluated and treated and all questions answered.  Ingrown Nail, right -Patient elects to proceed with minor surgery to remove ingrown toenail removal today. Consent reviewed and signed by patient. -Ingrown nail excised. See procedure note. -Educated on post-procedure care including soaking. Written instructions provided and reviewed. -Patient to follow up in 2 weeks for nail check.  Procedure: Excision of Ingrown Toenail Location: Right 1st lateral nail borders. Anesthesia: Lidocaine 1% plain; 1.5 mL and Marcaine 0.5% plain; 1.5 mL, digital block. Skin Prep: Betadine. Dressing: Silvadene; telfa; dry, sterile, compression dressing. Technique: Following skin prep, the toe was exsanguinated and a tourniquet was secured at the base of the toe. The affected nail border was freed, split with a nail splitter, and excised. Chemical matrixectomy was then performed with phenol and irrigated out with alcohol. The tourniquet was then removed and sterile dressing applied. Disposition: Patient tolerated procedure well. Patient to return in 2 weeks for follow-up.    Tailor's Bunion Right -Discussed shoe gear modifications and padding  Return in about 2 weeks (around 12/08/2018) for Nail Check.

## 2018-11-24 NOTE — Patient Instructions (Signed)

## 2018-12-08 ENCOUNTER — Ambulatory Visit: Payer: Self-pay | Admitting: Podiatry

## 2018-12-19 ENCOUNTER — Encounter: Payer: Self-pay | Admitting: Gastroenterology

## 2018-12-19 ENCOUNTER — Ambulatory Visit (INDEPENDENT_AMBULATORY_CARE_PROVIDER_SITE_OTHER): Payer: Self-pay | Admitting: Gastroenterology

## 2018-12-19 VITALS — Ht 62.0 in | Wt 126.0 lb

## 2018-12-19 DIAGNOSIS — K625 Hemorrhage of anus and rectum: Secondary | ICD-10-CM

## 2018-12-19 DIAGNOSIS — Z800 Family history of malignant neoplasm of digestive organs: Secondary | ICD-10-CM

## 2018-12-19 DIAGNOSIS — R1084 Generalized abdominal pain: Secondary | ICD-10-CM

## 2018-12-19 DIAGNOSIS — K5909 Other constipation: Secondary | ICD-10-CM

## 2018-12-19 MED ORDER — SUPREP BOWEL PREP KIT 17.5-3.13-1.6 GM/177ML PO SOLN: 1.0000 | ORAL | 0 refills | Status: DC

## 2018-12-19 NOTE — Progress Notes (Signed)
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 The patient expressed understanding and agreed to proceed.  Reason for Consultation:  Vomiting and blood in the stool   IMPRESSION:  Intermittent, discrete episodes of abdominal pain with associated nausea, vomiting and bloating x 5 years    - intermittent pain x 5 years      - associated diaphoresis Rectal bleeding x 1 with longstanding rectal pain Chronic constipation treated with PRN mineral oil and correctol Personal history of colon polyps    - 3 prior colonoscopies, polyps removed on at least 1 if not 2 of those procedures    - patient cannot remember where they were performed Family history of colon cancer (mother in her 23s) Patient concerns about cancer  PLAN: Obtain prior endoscopy reports (? [facility] or [facility]) EGD with Colonoscopy Consider testing for porphyria if endoscopic evaluation is negative  I consented the patient discussing the risks, benefits, and alternatives to endoscopic evaluation. In particular, we discussed the risks that include, but are not limited to, reaction to medication, cardiopulmonary compromise, bleeding requiring blood transfusion, aspiration resulting in pneumonia, perforation requiring surgery, lack of diagnosis, severe illness requiring hospitalization, and even death. We reviewed the risk of missed lesion including polyps or  even cancer. The patient acknowledges these risks and asks that we proceed.   HPI: Carolyn Vincent is a 63 y.o. female self-referred for vomiting and blood in the stool. The history is obtained through the patient. She has a history of hypertension, tobacco abuse, hysterectomy, and a personal history of colon polyps.   Over 5 year history of episodic severe, constant, epigastric and RLQ abdominal pain with associated bloating, nausea and vomiting. Associated diaphoresis.  Last episode was 3 months ago. No change with eating, defecation, or movement.  No sensory nor motor neuropathy during or preceding the abdominal pain.  She avoids raw foods and red meat and feels this may be helping. Unable to identify any triggers. Unable to identify any other relieving features. Longstanding history of chronic constipation with a bowel movement every 3 days. No diarrhea. No recent change in bowel habits.   Recently had blood in the stool.  "One big glob." Occurred one day after her last episode of pain three months ago. Intermittent rectal pain not related to bleeding.   Will use mineral oil as needed if she does not have a BM. Will use Correctol after 3 days. No other associated symptoms. No identified exacerbating or relieving features.   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 Weight stable.   She denies the use of any NSAIDs.   Seen in ED 01/18/28 and 07/21/18 for her symptoms. Contrasted  CT scan showed a large stool burden.  Abdominal films showed no evidence for bowel obstruction.  The patient had normal liver enzymes and a lipase of 33.  White count 9.7, hemoglobin 15.8, platelets 369.  The patient requested a colonoscopy while in the ED. She was given Levsin and Fentanyl.  Three prior colonoscopies. Three polyps removed on the second colonoscopy. No polyps on her last colonoscopy. Last was 8-10 years ago. Awoke during the last procedure.  No prior upper endoscopy.   She is concerned  about cancer given her family history. The patient is requesting both a colonoscopy or upper endoscopy.   Mother with colon cancer in her 53s. Sister died of "toxic poisoning" at age 6. No known family history of colon cancer or polyps. No family history of uterine/endometrial cancer, pancreatic cancer or gastric/stomach cancer.  Prior abdominal imaging: CT of the abdomen and pelvis 01/17/2016: 8 mm suspected hepatic hemangioma, Riedel's lobe, degenerative changes in the lumbar spine no source for abdominal pain identified CT of the abdomen and pelvis 01/17/2018: Large stool burden without evidence of obstruction.  No etiology for acute pain. Abdominal films 07/21/2018: No evidence of bowel obstruction  Past Medical History:  Diagnosis Date   Asthma 03/30/2012   Reports Hx of "asthma attacks" but not asthma.   Chest pain 03/30/2012   Recurrent; scales at 5 of 10 at this time (12:45)   Hypertension    Tendonitis of ankle or foot 03/30/2012   Left side    Past Surgical History:  Procedure Laterality Date   ABDOMINAL HYSTERECTOMY      Current Outpatient Medications  Medication Sig Dispense Refill   lisinopril (ZESTRIL) 5 MG tablet Take 5 mg by mouth daily.     No current facility-administered medications for this visit.     Allergies as of 12/19/2018 - Review Complete 12/19/2018  Allergen Reaction Noted   Benzodiazepines Other (See Comments) 04/03/2012   Indomethacin Anaphylaxis 04/02/2012   Sulfa antibiotics Nausea And Vomiting 12/02/2011   Codeine Nausea And Vomiting 12/02/2011    No family history on file.  Social History   Socioeconomic History   Marital status: Single    Spouse name: Not on file   Number of children: Not on file   Years of education: Not on file   Highest education level: Not on file  Occupational History   Not on file  Social Needs   Financial resource strain: Not on file   Food insecurity    Worry: Not on file    Inability: Not  on file   Transportation needs    Medical: Not on file    Non-medical: Not on file  Tobacco Use   Smoking status: Current Every Day Smoker    Packs/day: 1.00    Years: 40.00    Pack years: 40.00    Types: Cigarettes   Smokeless tobacco: Never Used  Substance and Sexual Activity   Alcohol use: No    Comment: occ   Drug use: No   Sexual activity: Yes    Birth control/protection: Surgical    Comment: hysterectomy 1995 and 2005  Lifestyle   Physical activity    Days per week: Not on file    Minutes per session: Not on file   Stress: Not on file  Relationships   Social connections    Talks on phone: Not on file    Gets together: Not on file    Attends religious service: Not on file    Active member of club or organization: Not on file    Attends meetings of clubs or organizations: Not on file    Relationship status: Not on file   Intimate partner violence    Fear of current or ex partner: Not on file    Emotionally abused: Not on file  Physically abused: Not on file    Forced sexual activity: Not on file  Other Topics Concern   Not on file  Social History Narrative   Not on file    Review of Systems: ALL ROS discussed and all others negative except listed in HPI.  Physical Exam: Complete physical exam not performed due to the limits inherent in a telehealth encounter.  General: Awake, alert, and oriented, and well communicative. In no acute distress.  HEENT: EOMI, non-icteric sclera, NCAT, MMM  Neck: Normal movement of head and neck  Pulm: No labored breathing, speaking in full sentences without conversational dyspnea  Derm: No apparent lesions or bruising in visible field  MS: Moves all visible extremities without noticeable abnormality  Psych: Pleasant, cooperative, normal speech, normal affect and normal insight Neuro: Alert and appropriate   Xiong Haidar L. Tarri Glenn, MD, MPH [facility] 12/19/2018, 1:47 PM

## 2019-01-30 ENCOUNTER — Telehealth: Payer: Self-pay | Admitting: Gastroenterology

## 2019-01-30 NOTE — Telephone Encounter (Signed)
NO answer and vmail not setup to leave message regarding Covid-19 screening questions. Covid-19 Screening Questions:   Do you now or have you had a fever in the last 14 days?    Do you have any respiratory symptoms of shortness of breath or cough now or in the last 14 days?    Do you have any family members or close contacts with diagnosed  or suspected Covid-19 in the past 14 days?    Have you been tested for Covid-19 and found to be positive?

## 2019-01-30 NOTE — Telephone Encounter (Signed)
Returned call and answered NO to all the screening questions.  Pt made aware of that care partner may wait in the car or come up to the lobby during the procedure but will need to provide their own mask.

## 2019-02-02 ENCOUNTER — Encounter: Payer: Self-pay | Admitting: Gastroenterology

## 2019-02-02 ENCOUNTER — Other Ambulatory Visit: Payer: Self-pay

## 2019-02-02 ENCOUNTER — Ambulatory Visit (AMBULATORY_SURGERY_CENTER): Payer: Self-pay | Admitting: Gastroenterology

## 2019-02-02 VITALS — BP 136/78 | HR 63 | Temp 98.9°F | Resp 13 | Ht 62.0 in | Wt 126.0 lb

## 2019-02-02 DIAGNOSIS — K3189 Other diseases of stomach and duodenum: Secondary | ICD-10-CM

## 2019-02-02 DIAGNOSIS — D122 Benign neoplasm of ascending colon: Secondary | ICD-10-CM

## 2019-02-02 DIAGNOSIS — Z800 Family history of malignant neoplasm of digestive organs: Secondary | ICD-10-CM

## 2019-02-02 DIAGNOSIS — K648 Other hemorrhoids: Secondary | ICD-10-CM

## 2019-02-02 DIAGNOSIS — K2980 Duodenitis without bleeding: Secondary | ICD-10-CM

## 2019-02-02 DIAGNOSIS — R1084 Generalized abdominal pain: Secondary | ICD-10-CM

## 2019-02-02 DIAGNOSIS — K635 Polyp of colon: Secondary | ICD-10-CM

## 2019-02-02 DIAGNOSIS — K5730 Diverticulosis of large intestine without perforation or abscess without bleeding: Secondary | ICD-10-CM

## 2019-02-02 MED ORDER — SODIUM CHLORIDE 0.9 % IV SOLN: 500.0000 mL | Freq: Once | INTRAVENOUS | Status: AC

## 2019-02-02 NOTE — Op Note (Addendum)
[place] Patient Name: Carolyn Vincent Procedure Date: 02/02/2019 7:34 AM MRN: [id number] Endoscopist: Thornton Park MD, MD Age: 63 Referring MD:  Date of Birth: 1955-06-29 Gender: Female Account #: [id number] Procedure:                Colonoscopy Indications:              Generalized abdominal pain                           Intermittent, discrete episodes of abdominal pain                            with associated nausea, vomiting and bloating x 5                            years                           - intermittent pain x 5 years                           - associated diaphoresis                           Rectal bleeding x 1 with longstanding rectal pain                           Chronic constipation treated with PRN mineral oil                            and correctol                           Personal history of colon polyps                           - 3 prior colonoscopies, polyps removed on at least                            1 if not 2 of those procedures                           - patient cannot remember where they were performed                           Family history of colon cancer (mother in her 47s) Medicines:                See the Anesthesia note for documentation of the                            administered medications Procedure:                Pre-Anesthesia Assessment:                           - Prior to the procedure, a History and Physical  was performed, and patient medications and                            allergies were reviewed. The patient's tolerance of                            previous anesthesia was also reviewed. The risks                            and benefits of the procedure and the sedation                            options and risks were discussed with the patient.                            All questions were answered, and informed consent                            was obtained. Prior  Anticoagulants: The patient has                            taken no previous anticoagulant or antiplatelet                            agents. ASA Grade Assessment: II - A patient with                            mild systemic disease. After reviewing the risks                            and benefits, the patient was deemed in                            satisfactory condition to undergo the procedure.                           After obtaining informed consent, the colonoscope                            was passed under direct vision. Throughout the                            procedure, the patient's blood pressure, pulse, and                            oxygen saturations were monitored continuously. The                            Colonoscope was introduced through the anus and                            advanced to the the terminal ileum, with  identification of the appendiceal orifice and IC                            valve. A second foward view of the right colon was                            performed. The colonoscopy was performed without                            difficulty. The patient tolerated the procedure                            well. The quality of the bowel preparation was                            excellent. The terminal ileum, ileocecal valve,                            appendiceal orifice, and rectum were photographed. Scope In: 7:47:35 AM Scope Out: 7:59:30 AM Scope Withdrawal Time: 0 hours 8 minutes 6 seconds  Total Procedure Duration: 0 hours 11 minutes 55 seconds  Findings:                 The perianal and digital rectal examinations were                            normal.                           A few small and large-mouthed diverticula were                            found in the sigmoid colon.                           A 4 mm polyp was found in the ascending colon. The                            polyp was flat. The polyp was removed  with a cold                            snare. Resection and retrieval were complete.                            Estimated blood loss was minimal.                           Small, non-bleeding internal hemorrhoids were seen.                            The exam was otherwise without abnormality on                            direct and retroflexion views including view of the  distal terminal ileum. Complications:            No immediate complications. Estimated blood loss:                            Minimal. Estimated Blood Loss:     Estimated blood loss was minimal. Impression:               - Diverticulosis in the sigmoid colon.                           - One 4 mm polyp in the ascending colon, removed                            with a cold snare. Resected and retrieved.                           - Small internal hemorrhoids.                           - The examination was otherwise normal on direct                            and retroflexion views.                           - No source for abdominal pain identified on this                            examination. Recommendation:           - Patient has a contact number available for                            emergencies. The signs and symptoms of potential                            delayed complications were discussed with the                            patient. Return to normal activities tomorrow.                            Written discharge instructions were provided to the                            patient.                           - Patient has a contact number available for                            emergencies. The signs and symptoms of potential                            delayed complications were discussed with the  patient. Return to normal activities tomorrow.                            Written discharge instructions were provided to the                             patient.                           - Resume previous diet today.                           - Continue present medications.                           - Await pathology results.                           - Surveillance colonoscopy in 5 years. Thornton Park MD, MD 02/02/2019 8:08:37 AM This report has been signed electronically.

## 2019-02-02 NOTE — Op Note (Signed)
[place] Patient Name: Alfrieda Tarry Procedure Date: 02/02/2019 7:35 AM MRN: [id number] Endoscopist: Thornton Park MD, MD Age: 63 Referring MD:  Date of Birth: July 01, 1955 Gender: Female Account #: [id number] Procedure:                Upper GI endoscopy Indications:              Generalized abdominal pain, Abdominal bloating,                            Nausea with vomiting Medicines:                See the Anesthesia note for documentation of the                            administered medications Procedure:                Pre-Anesthesia Assessment:                           - Prior to the procedure, a History and Physical                            was performed, and patient medications and                            allergies were reviewed. The patient's tolerance of                            previous anesthesia was also reviewed. The risks                            and benefits of the procedure and the sedation                            options and risks were discussed with the patient.                            All questions were answered, and informed consent                            was obtained. Prior Anticoagulants: The patient has                            taken no previous anticoagulant or antiplatelet                            agents. ASA Grade Assessment: II - A patient with                            mild systemic disease. After reviewing the risks                            and benefits, the patient was deemed in  satisfactory condition to undergo the procedure.                           After obtaining informed consent, the endoscope was                            passed under direct vision. Throughout the                            procedure, the patient's blood pressure, pulse, and                            oxygen saturations were monitored continuously. The                            Endoscope was introduced through the  mouth, and                            advanced to the third part of duodenum. The upper                            GI endoscopy was accomplished without difficulty.                            The patient tolerated the procedure well. Scope In: Scope Out: Findings:                 The esophagus was normal.                           Diffuse mildly erythematous mucosa without bleeding                            was found in the gastric body. Biopsies were taken                            with a cold forceps for histology.                           The examined duodenum was normal except for mild                            erythema in the duodenal bulb. Biopsies were taken                            with a cold forceps for histology.                           The cardia and gastric fundus were normal on                            retroflexion.                           The exam was otherwise without abnormality. Complications:  No immediate complications. Estimated blood loss:                            Minimal. Estimated Blood Loss:     Estimated blood loss was minimal. Impression:               - Normal esophagus.                           - Erythematous mucosa in the gastric body. Biopsied.                           - Mild duodenal bulb duodenitis. Biopsied.                           - The examination was otherwise normal. Recommendation:           - Patient has a contact number available for                            emergencies. The signs and symptoms of potential                            delayed complications were discussed with the                            patient. Return to normal activities tomorrow.                            Written discharge instructions were provided to the                            patient.                           - Resume previous diet today.                           - Continue present medications.                           - Await  pathology results.                           - Avoid all non-steroidal antiinflammatory                            medications.                           - Proceed with colonoscopy today as previously                            planned. Thornton Park MD, MD 02/02/2019 8:05:11 AM This report has been signed electronically.

## 2019-02-02 NOTE — Progress Notes (Signed)
[place]

## 2019-02-02 NOTE — Progress Notes (Signed)
Called to room to assist during endoscopic procedure.  Patient ID and intended procedure confirmed with present staff. Received instructions for my participation in the procedure from the performing physician.  

## 2019-02-02 NOTE — Progress Notes (Signed)
To PACU, VSS. Report to [email or web address] 

## 2019-02-02 NOTE — Patient Instructions (Signed)
YOU HAD AN ENDOSCOPIC PROCEDURE TODAY AT [facility]:   Refer to the procedure report that was given to you for any specific questions about what was found during the examination.  If the procedure report does not answer your questions, please call your gastroenterologist to clarify.  If you requested that your care partner not be given the details of your procedure findings, then the procedure report has been included in a sealed envelope for you to review at your convenience later.  YOU SHOULD EXPECT: Some feelings of bloating in the abdomen. Passage of more gas than usual.  Walking can help get rid of the air that was put into your GI tract during the procedure and reduce the bloating. If you had a lower endoscopy (such as a colonoscopy or flexible sigmoidoscopy) you may notice spotting of blood in your stool or on the toilet paper. If you underwent a bowel prep for your procedure, you may not have a normal bowel movement for a few days.  Please Note:  You might notice some irritation and congestion in your nose or some drainage.  This is from the oxygen used during your procedure.  There is no need for concern and it should clear up in a day or so.  SYMPTOMS TO REPORT IMMEDIATELY:   Following lower endoscopy (colonoscopy or flexible sigmoidoscopy):  Excessive amounts of blood in the stool  Significant tenderness or worsening of abdominal pains  Swelling of the abdomen that is new, acute  Fever of 100F or higher   Following upper endoscopy (EGD)  Vomiting of blood or coffee ground material  New chest pain or pain under the shoulder blades  Painful or persistently difficult swallowing  New shortness of breath  Fever of 100F or higher  Black, tarry-looking stools   Please see handout given to you on Polyps.  For urgent or emergent issues, a gastroenterologist can be reached at any hour by calling [phone].   DIET:  We do recommend a small meal at first, but  then you may proceed to your regular diet.  Drink plenty of fluids but you should avoid alcoholic beverages for 24 hours.  ACTIVITY:  You should plan to take it easy for the rest of today and you should NOT DRIVE or use heavy machinery until tomorrow (because of the sedation medicines used during the test).    FOLLOW UP: Our staff will call the number listed on your records 48-72 hours following your procedure to check on you and address any questions or concerns that you may have regarding the information given to you following your procedure. If we do not reach you, we will leave a message.  We will attempt to reach you two times.  During this call, we will ask if you have developed any symptoms of COVID 19. If you develop any symptoms (ie: fever, flu-like symptoms, shortness of breath, cough etc.) before then, please call [phone].  If you test positive for Covid 19 in the 2 weeks post procedure, please call and report this information to [place].    If any biopsies were taken you will be contacted by phone or by letter within the next 1-3 weeks.  Please call us at [phone] if you have not heard about the biopsies in 3 weeks.    SIGNATURES/CONFIDENTIALITY: You and/or your care partner have signed paperwork which will be entered into your electronic medical record.  These signatures attest to the fact that that the information above  on your After Visit Summary has been reviewed and is understood.  Full responsibility of the confidentiality of this discharge information lies with you and/or your care-partner.  Thank you for letting us take care of your healthcare needs today.

## 2019-02-02 NOTE — Progress Notes (Signed)
Pt's states no medical or surgical changes since previsit or office visit. 

## 2019-02-04 ENCOUNTER — Telehealth: Payer: Self-pay

## 2019-02-04 NOTE — Telephone Encounter (Signed)
  Follow up Call-  Call back number 02/02/2019  Post procedure Call Back phone  # [phone]  Permission to leave phone message Yes  Some recent data might be hidden     Patient questions:  Do you have a fever, pain , or abdominal swelling? No. Pain Score  0 *  Have you tolerated food without any problems? Yes.    Have you been able to return to your normal activities? Yes.    Do you have any questions about your discharge instructions: Diet   No. Medications  No. Follow up visit  No.  Do you have questions or concerns about your Care? No.  Actions: * If pain score is 4 or above: No action needed, pain <4.  1. Have you developed a fever since your procedure? no  2.   Have you had an respiratory symptoms (SOB or cough) since your procedure? no  3.   Have you tested positive for COVID 19 since your procedure no  4.   Have you had any family members/close contacts diagnosed with the COVID 19 since your procedure?  no   If yes to any of these questions please route to Joylene John, RN and Alphonsa Gin, Therapist, sports.

## 2019-02-06 ENCOUNTER — Telehealth: Payer: Self-pay | Admitting: Gastroenterology

## 2019-02-06 NOTE — Telephone Encounter (Signed)
Pt inquired about biopsy results.

## 2019-02-06 NOTE — Telephone Encounter (Signed)
Spoke to patient. Told patient when Dr. Tarri Glenn returns to the office she would either be called or sent a letter notifying her of the results.

## 2019-02-17 ENCOUNTER — Telehealth: Payer: Self-pay | Admitting: *Deleted

## 2019-02-17 NOTE — Telephone Encounter (Signed)
FYI- This RN followed up with  [facility] to confirm that the patient had been contacted and scheduled for a consultation for food allergy testing. It was reported to this RN that the patient was contacted but declined to be scheduled due to cost.

## 2020-02-08 IMAGING — CT CT ABD-PELV W/ CM
2 of 5 series · 15 of 46 positions shown, 17 images · IV contrast (omnipaque)
Comparison: CT abdomen pelvis-01/17/2016; 05/28/2013

CLINICAL DATA: Abdominal pain, diarrhea and emesis for the past
several weeks.

EXAM:
CT ABDOMEN AND PELVIS WITH CONTRAST
TECHNIQUE: Multidetector CT imaging of the abdomen and pelvis was performed
using the standard protocol following bolus administration of
intravenous contrast.
CONTRAST:  100mL OMNIPAQUE IOHEXOL 300 MG/ML  SOLN

[Series 3: a/p w/ 5mm · axial · 0.60mm/px · z∈[+912,+1312]mm · 12 of 90 slices shown, 14 images]
[im 5/90  soft-tissue]
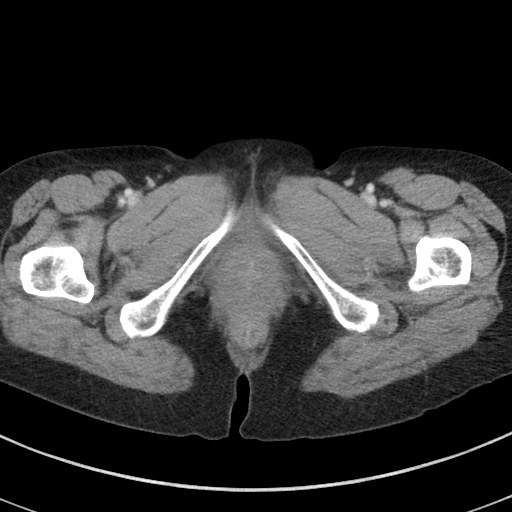
[im 5/90  bone]
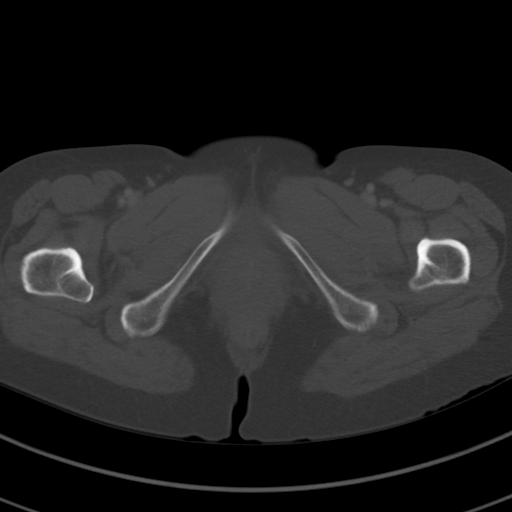
[im 15/90  soft-tissue]
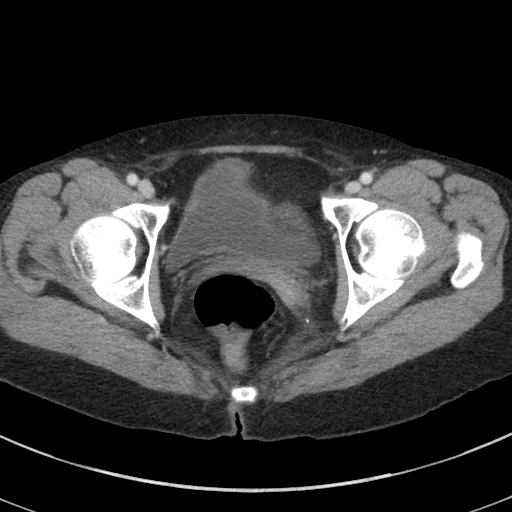
[im 20/90  soft-tissue]
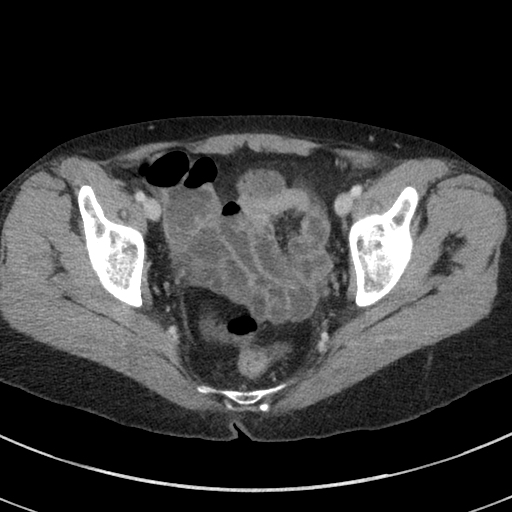
[im 25/90  soft-tissue]
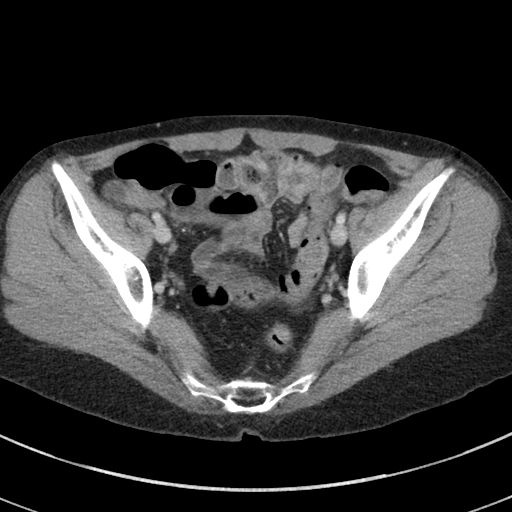
[im 35/90  soft-tissue]
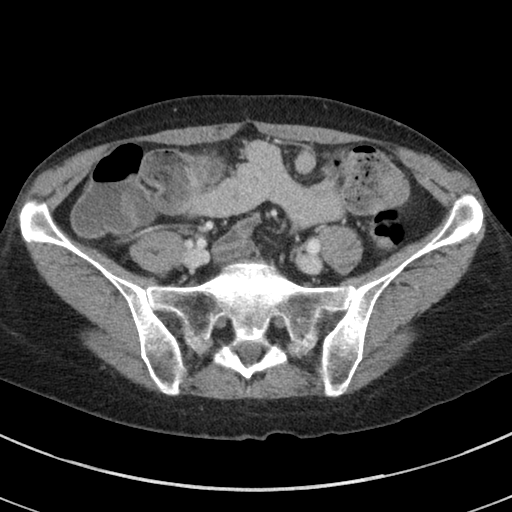
[im 40/90  soft-tissue]
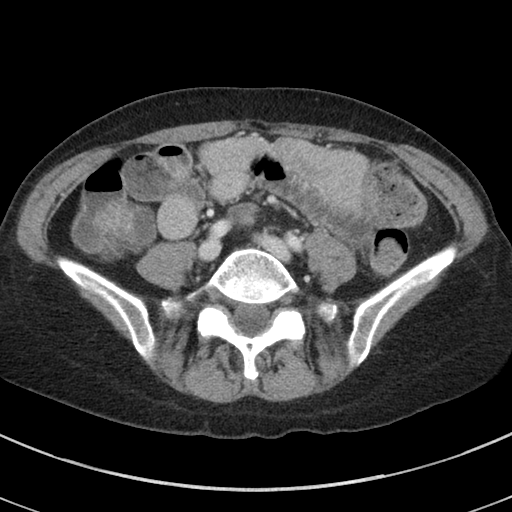
[im 50/90  soft-tissue]
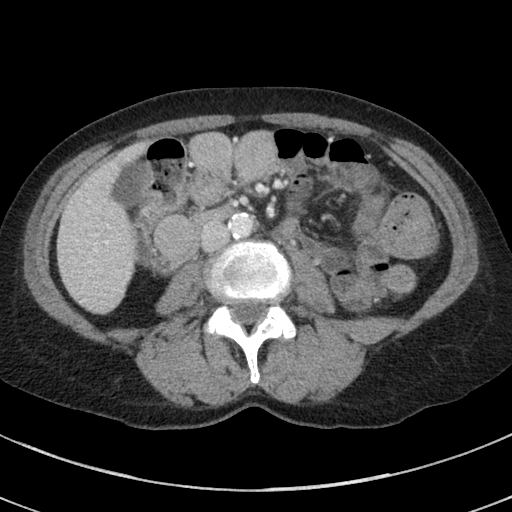
[im 55/90  soft-tissue]
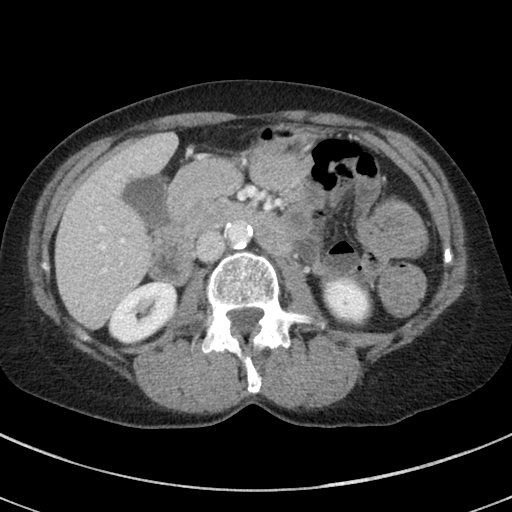
[im 65/90  soft-tissue]
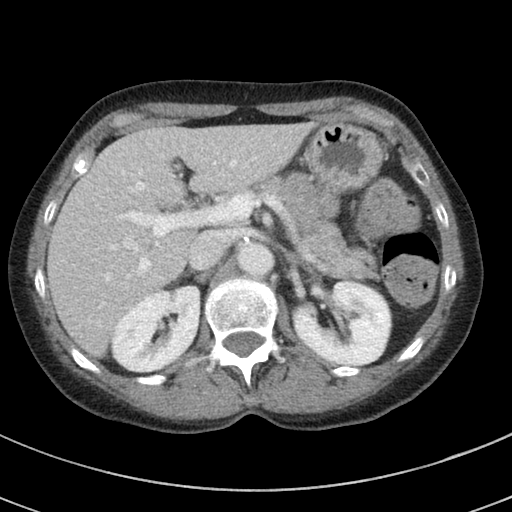
[im 65/90  bone]
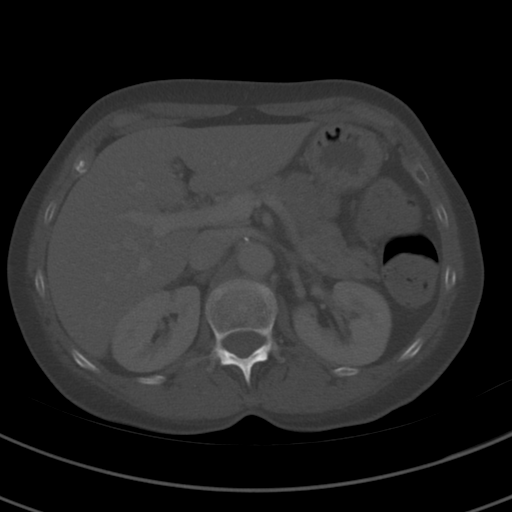
[im 70/90  soft-tissue]
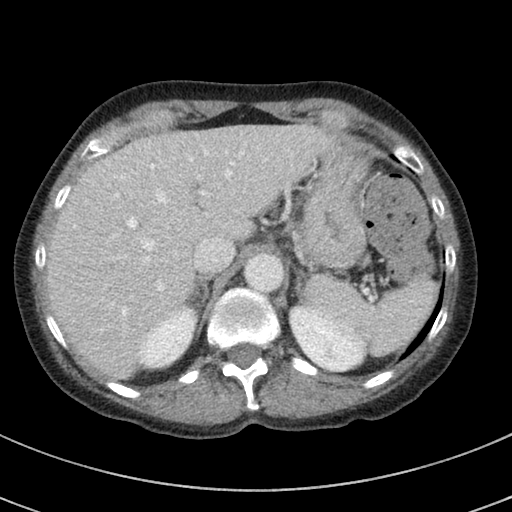
[im 75/90  soft-tissue]
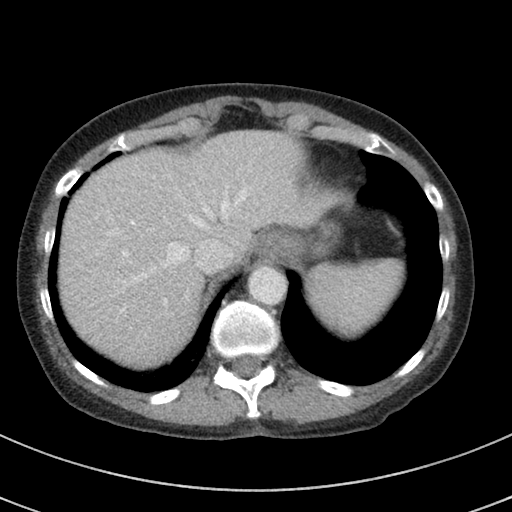
[im 85/90  soft-tissue]
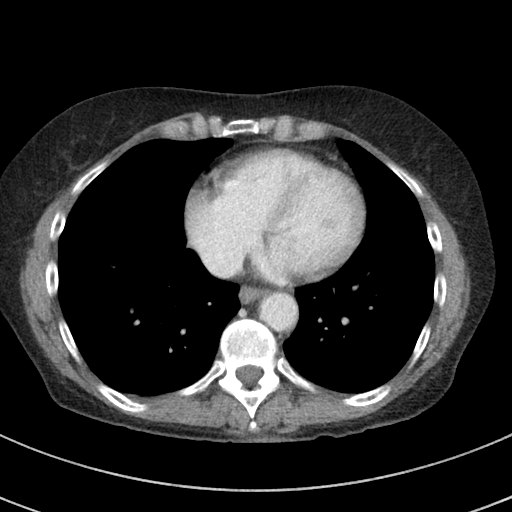

[Series 6: a/p w/ cor · coronal · 0.62mm/px · 3 of 126 slices shown]
[im 42/126  soft-tissue]
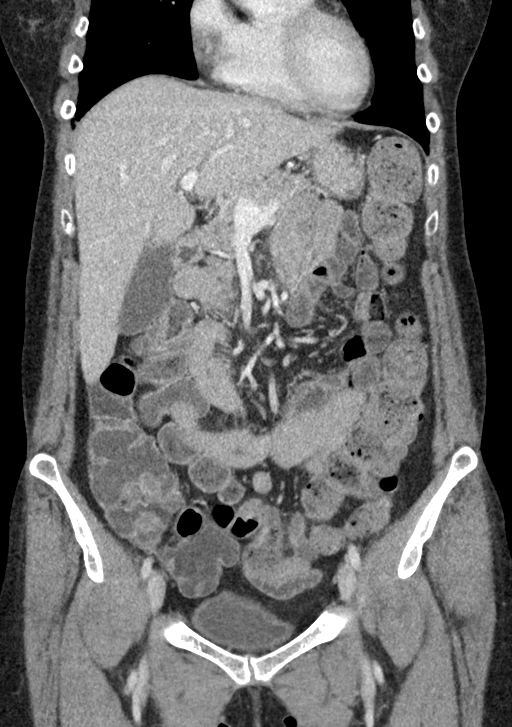
[im 56/126  soft-tissue]
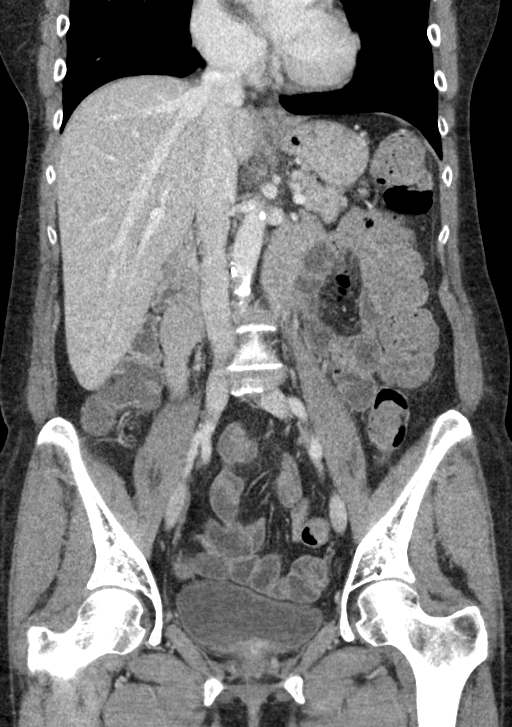
[im 70/126  soft-tissue]
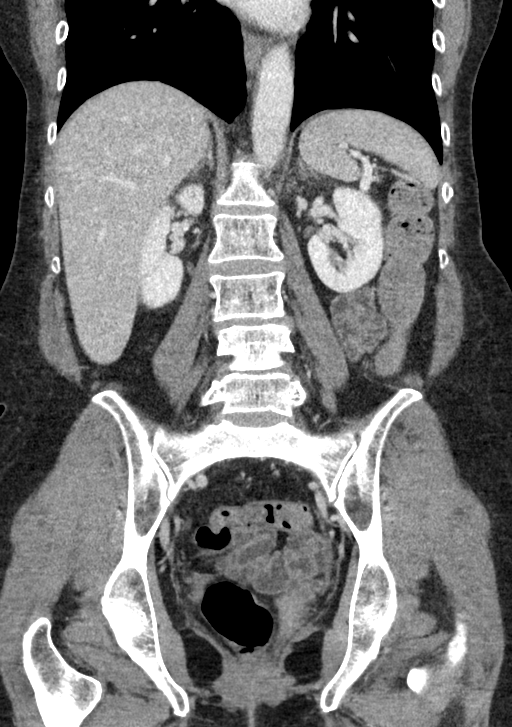

[15 of 46 positions shown; findings below may reference images not displayed]

FINDINGS: Lower chest: Limited visualization of the lower thorax demonstrates
minimal ground-glass atelectasis within the bilateral lung bases,
right greater than left. No discrete focal airspace opacities. No
pleural effusion.

Normal heart size.  No pericardial effusion.

Hepatobiliary: Normal hepatic contour. No discrete hepatic lesions.
Normal appearance of the gallbladder given degree distention. No
radiopaque gallstones. No intra or extrahepatic biliary ductal
dilatation. No ascites.

Pancreas: Normal appearance of the pancreas

Spleen: Normal appearance of the spleen

Adrenals/Urinary Tract: There is symmetric enhancement and excretion
of the bilateral kidneys. Punctate calcification about the right
renal hilum is favored to be vascular in etiology. No definite renal
stones on this post contrast examination. Punctate subcentimeter
hypoattenuating lesion with the interpolar aspect of the left kidney
is too small to accurately characterize though favored to represent
a renal cyst. No urine obstruction or perinephric stranding.

Normal appearance of the bilateral adrenal glands.

Normal appearance of the urinary bladder given degree distention.

Stomach/Bowel: Large colonic stool burden without evidence of
enteric obstruction. No discrete areas of bowel wall thickening.
Normal appearance of the terminal ileum and retrocecal appendix. No
pneumoperitoneum, pneumatosis or portal venous gas.

Vascular/Lymphatic: Mixed calcified and noncalcified atherosclerotic
plaque within a mildly tortuous but normal caliber abdominal aorta,
not resulting in a hemodynamically significant stenosis. The major
branch vessels of the abdominal aorta appear patent on this non CTA
examination.

No bulky retroperitoneal, mesenteric, pelvic or inguinal
lymphadenopathy.

Reproductive: Post hysterectomy. No discrete adnexal lesion. No free
fluid the pelvic cul-de-sac.

Other: Regional soft tissues appear normal.

Musculoskeletal: No acute or aggressive osseous abnormalities. Grade
1 anterolisthesis of L4 upon L5 with moderate DDD of L4-L5 with disc
space height loss, endplate irregularity and sclerosis. No
associated pars defects.
IMPRESSION: 1. Large colonic stool burden without evidence of enteric
obstruction.
2.  Aortic Atherosclerosis ([id number]).
